# Patient Record
Sex: Female | Born: 1990 | Race: Black or African American | Marital: Single | Smoking: Never smoker
Health system: Southern US, Community
[De-identification: ages and names within clinical notes are randomized; demographics above are authoritative.]

## PROBLEM LIST (undated history)

## (undated) ENCOUNTER — Inpatient Hospital Stay (HOSPITAL_COMMUNITY): Payer: Self-pay

## (undated) DIAGNOSIS — Z789 Other specified health status: Secondary | ICD-10-CM

## (undated) HISTORY — PX: APPENDECTOMY: SHX54

---

## 2014-12-23 ENCOUNTER — Encounter: Payer: Self-pay | Admitting: Family

## 2014-12-23 ENCOUNTER — Ambulatory Visit (INDEPENDENT_AMBULATORY_CARE_PROVIDER_SITE_OTHER): Payer: BLUE CROSS/BLUE SHIELD | Admitting: Family

## 2014-12-23 ENCOUNTER — Telehealth: Payer: Self-pay | Admitting: *Deleted

## 2014-12-23 VITALS — BP 100/60 | HR 65 | Temp 98.0°F | Resp 14 | Ht 59.5 in | Wt 114.6 lb

## 2014-12-23 DIAGNOSIS — M549 Dorsalgia, unspecified: Secondary | ICD-10-CM | POA: Insufficient documentation

## 2014-12-23 DIAGNOSIS — Z23 Encounter for immunization: Secondary | ICD-10-CM

## 2014-12-23 DIAGNOSIS — R102 Pelvic and perineal pain: Secondary | ICD-10-CM | POA: Insufficient documentation

## 2014-12-23 DIAGNOSIS — M545 Low back pain: Secondary | ICD-10-CM

## 2014-12-23 DIAGNOSIS — Z0000 Encounter for general adult medical examination without abnormal findings: Secondary | ICD-10-CM

## 2014-12-23 DIAGNOSIS — N946 Dysmenorrhea, unspecified: Secondary | ICD-10-CM | POA: Insufficient documentation

## 2014-12-23 LAB — CBC WITH DIFFERENTIAL/PLATELET
BASOS ABS: 0 10*3/uL (ref 0.0–0.1)
Basophils Relative: 0.8 % (ref 0.0–3.0)
EOS PCT: 1.2 % (ref 0.0–5.0)
Eosinophils Absolute: 0 10*3/uL (ref 0.0–0.7)
HCT: 38.2 % (ref 36.0–46.0)
HEMOGLOBIN: 12.9 g/dL (ref 12.0–15.0)
LYMPHS PCT: 46.2 % — AB (ref 12.0–46.0)
Lymphs Abs: 1.6 10*3/uL (ref 0.7–4.0)
MCHC: 33.7 g/dL (ref 30.0–36.0)
MCV: 86.4 fl (ref 78.0–100.0)
MONO ABS: 0.3 10*3/uL (ref 0.1–1.0)
MONOS PCT: 7.6 % (ref 3.0–12.0)
NEUTROS ABS: 1.5 10*3/uL (ref 1.4–7.7)
Neutrophils Relative %: 44.2 % (ref 43.0–77.0)
Platelets: 234 10*3/uL (ref 150.0–400.0)
RBC: 4.42 Mil/uL (ref 3.87–5.11)
RDW: 12.7 % (ref 11.5–15.5)
WBC: 3.5 10*3/uL — ABNORMAL LOW (ref 4.0–10.5)

## 2014-12-23 LAB — HEPATIC FUNCTION PANEL
ALT: 7 U/L (ref 0–35)
AST: 14 U/L (ref 0–37)
Albumin: 4.2 g/dL (ref 3.5–5.2)
Alkaline Phosphatase: 42 U/L (ref 39–117)
BILIRUBIN DIRECT: 0.1 mg/dL (ref 0.0–0.3)
Total Bilirubin: 0.8 mg/dL (ref 0.2–1.2)
Total Protein: 7.4 g/dL (ref 6.0–8.3)

## 2014-12-23 LAB — LIPID PANEL
CHOL/HDL RATIO: 4
Cholesterol: 170 mg/dL (ref 0–200)
HDL: 45.9 mg/dL (ref >39.00)
LDL CALC: 112 mg/dL — AB (ref 0–99)
NonHDL: 124.1
TRIGLYCERIDES: 59 mg/dL (ref 0.0–149.0)
VLDL: 11.8 mg/dL (ref 0.0–40.0)

## 2014-12-23 LAB — BASIC METABOLIC PANEL
BUN: 10 mg/dL (ref 6–23)
CALCIUM: 9.7 mg/dL (ref 8.4–10.5)
CO2: 27 mEq/L (ref 19–32)
CREATININE: 0.76 mg/dL (ref 0.40–1.20)
Chloride: 106 mEq/L (ref 96–112)
GFR: 118.6 mL/min (ref >60.00)
Glucose, Bld: 91 mg/dL (ref 70–99)
POTASSIUM: 4.2 meq/L (ref 3.5–5.1)
Sodium: 137 mEq/L (ref 135–145)

## 2014-12-23 LAB — URINALYSIS, ROUTINE W REFLEX MICROSCOPIC
BILIRUBIN URINE: NEGATIVE
HGB URINE DIPSTICK: NEGATIVE
Ketones, ur: NEGATIVE
LEUKOCYTES UA: NEGATIVE
NITRITE: NEGATIVE
Specific Gravity, Urine: 1.025 (ref 1.000–1.030)
TOTAL PROTEIN, URINE-UPE24: NEGATIVE
UROBILINOGEN UA: 0.2 (ref 0.0–1.0)
Urine Glucose: NEGATIVE
pH: 6 (ref 5.0–8.0)

## 2014-12-23 LAB — TSH: TSH: 1.22 u[IU]/mL (ref 0.35–4.50)

## 2014-12-23 MED ORDER — MELOXICAM 7.5 MG PO TABS: 7.5000 mg | ORAL_TABLET | Freq: Every day | ORAL | Status: DC

## 2014-12-23 NOTE — Telephone Encounter (Signed)
Pt seen on 12/23/14. Did not have wellness form with her. Waist circumference (26.5") Wellness form does not require physician signature. Pt states she may not be able to get another printed copy. Advised her I could give her verbal results / readings for required values to complete her form if hard copy not available to send us. I will call pt on Friday to determine status.

## 2014-12-23 NOTE — Assessment & Plan Note (Signed)
We discussed add OCP for birth control and to help with dysmenorrhea, pt declines birth control at this time. Advised NSAIDS to begin a few days before menses.

## 2014-12-23 NOTE — Progress Notes (Signed)
Subjective:    Patient ID: Emily Gomez, female    DOB: 04/15/1991, 24 y.o.   MRN: [id number]  HPI  Emily Gomez is a 24 yr old female who presents today to establish care.    Reports menstrual cycle q28 days.  Cycle 4-5 days.  Reports that the first day is heavy but then lightens up.  Reports that the cramping is bad on the first and last day.  Uses aleve prn which helps the pain.  Does not wish to become pregnant, not using condoms, declines birth control.    Back pain- reports that this started recently as a sharp pain in her back.  Reports that pain is located in the lower back. Pain is in the low back, has not tried anything for the pain.  Pain is not worsened by movements.  Denies associated dysuria or hematuria.   Review of Systems  Constitutional:       Reports that she has lost 5 pounds   HENT: Negative for hearing loss.   Respiratory: Negative for cough.   Cardiovascular:       Denies current chest pain  Gastrointestinal: Negative for constipation.  Genitourinary: Negative for dysuria and hematuria.  Musculoskeletal: Positive for back pain.  Skin: Negative for rash.  Neurological: Negative for headaches.  Hematological: Negative for adenopathy.  Psychiatric/Behavioral:       Denies depression/anxiety   History reviewed. No pertinent past medical history.  History   Social History  . Marital Status: Single    Spouse Name: N/A    Number of Children: N/A  . Years of Education: N/A   Occupational History  . Not on file.   Social History Main Topics  . Smoking status: Never Smoker   . Smokeless tobacco: Never Used  . Alcohol Use: No  . Drug Use: Not on file  . Sexual Activity: Not on file   Other Topics Concern  . Not on file   Social History Narrative   Lives with boyfriend and his mother   No children   Works at the Emergency planning/management officerdeli dept at [employer]   She is studying medical office administration at [employer]   [place] here [place]   Has cousins  here but rest of family is back in [place]       Past Surgical History  Procedure Laterality Date  . Appendectomy      Family History  Problem Relation Age of Onset  . Cancer Neg Hx   . Kidney disease Neg Hx   . Heart disease Neg Hx     No Known Allergies  No current outpatient prescriptions on file prior to visit.   No current facility-administered medications on file prior to visit.    BP 100/60 mmHg  Pulse 65  Temp(Src) 98 F (36.7 C) (Oral)  Resp 14  Ht 4' 11.5" (1.511 m)  Wt 114 lb 9.6 oz (51.982 kg)  BMI 22.77 kg/m2  SpO2 99%  LMP 12/02/2014       Objective:   Physical Exam  Constitutional: She appears well-developed and well-nourished. No distress.  HENT:  Head: Normocephalic.  Cardiovascular: Normal rate and regular rhythm.   No murmur heard. Pulmonary/Chest: Effort normal and breath sounds normal. No respiratory distress. She has no wheezes. She has no rales. She exhibits no tenderness.  Abdominal: Soft. She exhibits no distension. There is no tenderness. There is no rebound and no guarding.  Musculoskeletal: She exhibits no edema.  Thoracic back: She exhibits no tenderness.       Lumbar back: She exhibits no tenderness.  Skin: Skin is warm and dry.  Psychiatric: She has a normal mood and affect. Her behavior is normal. Thought content normal.          Assessment & Plan:  Pt is requesting lab work for insurance purposes which will be added on today. She will return for cpx.

## 2014-12-23 NOTE — Patient Instructions (Signed)
Please complete lab work prior to leaving. Start meloxicam once daily for next week for low back pain, let us know if back pain worsens or if it does not improve. Schedule physical at the front desk.  Welcome to [facility]!

## 2014-12-23 NOTE — Assessment & Plan Note (Signed)
likley musculoskeletal, add trial of meloxicam.

## 2014-12-24 ENCOUNTER — Encounter: Payer: Self-pay | Admitting: Family

## 2014-12-30 NOTE — Telephone Encounter (Signed)
Spoke with Lawanna KobusAngel, he will bring form by the office tomorrow around 12pm.

## 2014-12-30 NOTE — Telephone Encounter (Signed)
Left message for pt's friend, Lawanna Kobusngel to return my call.

## 2015-01-06 NOTE — Telephone Encounter (Signed)
I do not see that we have received form yet. Left message for Lawanna Kobusngel to return my call to determine status of form.

## 2015-01-06 NOTE — Telephone Encounter (Signed)
Form found and faxed to [facility] at [phone]. Left detailed message on Angel's voicemail that form has been found and faxed.

## 2015-01-15 ENCOUNTER — Ambulatory Visit (INDEPENDENT_AMBULATORY_CARE_PROVIDER_SITE_OTHER): Payer: BLUE CROSS/BLUE SHIELD | Admitting: Family

## 2015-01-15 ENCOUNTER — Encounter: Payer: Self-pay | Admitting: Family

## 2015-01-15 VITALS — BP 125/78 | HR 76 | Temp 97.9°F | Resp 16 | Ht 59.5 in | Wt 115.0 lb

## 2015-01-15 DIAGNOSIS — E010 Iodine-deficiency related diffuse (endemic) goiter: Secondary | ICD-10-CM | POA: Insufficient documentation

## 2015-01-15 DIAGNOSIS — Z0000 Encounter for general adult medical examination without abnormal findings: Secondary | ICD-10-CM | POA: Insufficient documentation

## 2015-01-15 NOTE — Progress Notes (Signed)
Pre visit review using our clinic review tool, if applicable. No additional management support is needed unless otherwise documented below in the visit note. 

## 2015-01-15 NOTE — Assessment & Plan Note (Signed)
Continue healthy diet, advised pt on importance of regular exercise.  Lab work is reviewed from last visit and appears stable.

## 2015-01-15 NOTE — Progress Notes (Signed)
Subjective:    Patient ID: Emily Gomez, female    DOB: 1991/02/16, 24 y.o.   MRN: [id number]  HPI  Ms. Swaim presents today for complete physical. Denies current health concerns.  Immunizations: Tdap 11/2014; declines flu shot Diet: eats healthy diet- plenty of fruits and vegetables. Doesn't eat much fast/processed food. Exercise: not exercising. Pap Smear: desires referral to gyn. Has never had pap smear.  1. Low back pain:  Was seen for back pain 12/23/2014. She reports pain is resolved.  Review of Systems  Constitutional: Negative for fever, fatigue and unexpected weight change.  HENT: Negative for congestion, hearing loss and rhinorrhea.   Eyes: Negative for visual disturbance.  Respiratory: Negative for cough and shortness of breath.   Cardiovascular: Negative for chest pain and leg swelling.  Gastrointestinal: Negative for abdominal pain, diarrhea and constipation.  Genitourinary: Negative for dysuria, frequency and menstrual problem (Reports period is regular.).  Musculoskeletal: Negative for myalgias and back pain.  Neurological: Negative for headaches.  Hematological: Does not bruise/bleed easily.  Psychiatric/Behavioral:       Denies depression/anxiety.   History reviewed. No pertinent past medical history.  History   Social History  . Marital Status: Single    Spouse Name: N/A  . Number of Children: N/A  . Years of Education: N/A   Occupational History  . Not on file.   Social History Main Topics  . Smoking status: Never Smoker   . Smokeless tobacco: Never Used  . Alcohol Use: No  . Drug Use: Not on file  . Sexual Activity: Not on file   Other Topics Concern  . 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 Appendectomy      Family History  Problem Relation Age of Onset  . Cancer Neg Hx   . Kidney disease Neg Hx   . Heart disease Neg Hx     No Known Allergies  Current Outpatient Prescriptions on File Prior to Visit  Medication Sig Dispense Refill  . meloxicam (MOBIC) 7.5 MG tablet Take 1 tablet (7.5 mg total) by mouth daily. For 1 week. (Patient not taking: Reported on 01/15/2015) 14 tablet 0   No current facility-administered medications on file prior to visit.    BP 125/78 mmHg  Pulse 76  Temp(Src) 97.9 F (36.6 C) (Oral)  Resp 16  Ht 4' 11.5" (1.511 m)  Wt 115 lb (52.164 kg)  BMI 22.85 kg/m2  SpO2 100%  LMP 12/02/2014       Objective:   Physical Exam  Physical Exam  Constitutional: She is oriented to person, place, and time. She appears well-developed and well-nourished. No distress.  HENT:  Head: Normocephalic and atraumatic.  Right Ear: Tympanic membrane and ear canal normal.  Left Ear: Tympanic membrane and ear canal normal.  Mouth/Throat: Oropharynx is clear and moist.  Eyes: Pupils are equal, round, and reactive to light. No scleral icterus.  Neck: Normal range of motion. Mild thyromegaly present. some fullness left lower thyroid lobe Cardiovascular: Normal rate and regular rhythm.   No murmur heard. Pulmonary/Chest: Effort normal and breath  sounds normal. No respiratory distress. He has no wheezes. She has no rales. She exhibits no tenderness.  Abdominal: Soft. Bowel sounds are normal. He exhibits no distension and no mass. There is no tenderness. There is no rebound and no guarding.  Musculoskeletal: She exhibits no edema.  Lymphadenopathy:    She has no cervical adenopathy.  Neurological: She is alert and oriented to person, place, and time.  She exhibits normal muscle tone. Coordination normal.  Skin: Skin is warm and dry.  Psychiatric: She has a normal mood and affect. Her behavior is normal.  Judgment and thought content normal.  Breast/pelvic: deferred to GYN          Assessment & Plan:         Assessment & Plan:

## 2015-01-15 NOTE — Assessment & Plan Note (Signed)
Will refer for thyroid ultrasound.   Lab Results  Component Value Date   TSH 1.22 12/23/2014   TSH is normal.

## 2015-01-15 NOTE — Patient Instructions (Addendum)
Try to add 30 minutes of exercise 5 days a week. You will be contacted about your thyroid ultrasound to further evaluation your thyroid, and about your referral to GYN. Follow up in 1 year for annual physical, sooner if problems/concerns.

## 2015-01-16 ENCOUNTER — Encounter: Payer: Self-pay | Admitting: Family

## 2015-01-19 ENCOUNTER — Ambulatory Visit (HOSPITAL_BASED_OUTPATIENT_CLINIC_OR_DEPARTMENT_OTHER): Payer: BLUE CROSS/BLUE SHIELD

## 2015-01-20 ENCOUNTER — Ambulatory Visit (HOSPITAL_BASED_OUTPATIENT_CLINIC_OR_DEPARTMENT_OTHER)
Admission: RE | Admit: 2015-01-20 | Discharge: 2015-01-20 | Disposition: A | Discharge status: Home / Self Care | Payer: BLUE CROSS/BLUE SHIELD | Source: Ambulatory Visit | Attending: Family | Admitting: Family

## 2015-01-20 DIAGNOSIS — E010 Iodine-deficiency related diffuse (endemic) goiter: Secondary | ICD-10-CM

## 2015-01-20 DIAGNOSIS — E049 Nontoxic goiter, unspecified: Secondary | ICD-10-CM | POA: Insufficient documentation

## 2015-02-05 ENCOUNTER — Other Ambulatory Visit: Payer: Self-pay | Admitting: Obstetrics & Gynecology

## 2015-02-05 DIAGNOSIS — N63 Unspecified lump in unspecified breast: Secondary | ICD-10-CM

## 2015-02-09 ENCOUNTER — Other Ambulatory Visit: Payer: BLUE CROSS/BLUE SHIELD

## 2016-03-01 ENCOUNTER — Encounter: Payer: Self-pay | Admitting: Family

## 2016-03-01 ENCOUNTER — Ambulatory Visit (INDEPENDENT_AMBULATORY_CARE_PROVIDER_SITE_OTHER): Payer: Managed Care, Other (non HMO) | Admitting: Family

## 2016-03-01 VITALS — BP 100/66 | HR 69 | Temp 98.3°F | Resp 16 | Ht 59.5 in | Wt 118.0 lb

## 2016-03-01 DIAGNOSIS — N63 Unspecified lump in unspecified breast: Secondary | ICD-10-CM

## 2016-03-01 DIAGNOSIS — R109 Unspecified abdominal pain: Secondary | ICD-10-CM

## 2016-03-01 LAB — CBC WITH DIFFERENTIAL/PLATELET
Basophils Absolute: 0 10*3/uL (ref 0.0–0.1)
Basophils Relative: 0.4 % (ref 0.0–3.0)
Eosinophils Absolute: 0.1 10*3/uL (ref 0.0–0.7)
Eosinophils Relative: 1.6 % (ref 0.0–5.0)
HCT: 37.4 % (ref 36.0–46.0)
Hemoglobin: 12.4 g/dL (ref 12.0–15.0)
LYMPHS ABS: 1.3 10*3/uL (ref 0.7–4.0)
Lymphocytes Relative: 37.1 % (ref 12.0–46.0)
MCHC: 33.1 g/dL (ref 30.0–36.0)
MCV: 87.2 fl (ref 78.0–100.0)
MONO ABS: 0.3 10*3/uL (ref 0.1–1.0)
Monocytes Relative: 8.1 % (ref 3.0–12.0)
NEUTROS PCT: 52.8 % (ref 43.0–77.0)
Neutro Abs: 1.8 10*3/uL (ref 1.4–7.7)
Platelets: 224 10*3/uL (ref 150.0–400.0)
RBC: 4.29 Mil/uL (ref 3.87–5.11)
RDW: 12.9 % (ref 11.5–15.5)
WBC: 3.5 10*3/uL — ABNORMAL LOW (ref 4.0–10.5)

## 2016-03-01 LAB — HEPATIC FUNCTION PANEL
ALK PHOS: 51 U/L (ref 39–117)
ALT: 8 U/L (ref 0–35)
AST: 14 U/L (ref 0–37)
Albumin: 4.3 g/dL (ref 3.5–5.2)
BILIRUBIN DIRECT: 0.1 mg/dL (ref 0.0–0.3)
BILIRUBIN TOTAL: 0.6 mg/dL (ref 0.2–1.2)
Total Protein: 7.6 g/dL (ref 6.0–8.3)

## 2016-03-01 MED ORDER — HYOSCYAMINE SULFATE 0.125 MG PO TABS
0.1250 mg | ORAL_TABLET | ORAL | Status: DC | PRN
Start: 2016-03-01 — End: 2016-09-02

## 2016-03-01 MED FILL — OSCIMIN 0.125 MG TABLET: 0.125 | 5 days supply | Qty: 30 | Fill #0

## 2016-03-01 NOTE — Progress Notes (Signed)
Pre visit review using our clinic review tool, if applicable. No additional management support is needed unless otherwise documented below in the visit note. 

## 2016-03-01 NOTE — Progress Notes (Signed)
Subjective:    Patient ID: Emily Gomez, female    DOB: [birth year], 25 y.o.   MRN: [id number]  HPI  Ms. Emily Gomez is a 25 yr old female who presents today with chief complaint of abdominal pain.  Reports that she had a resolving cold ant the abdominal pain started 2 weeks ago.  Reports pain is sharp and cramping in nature.  Notes episodes of constipation.  Then has some soft mucoid stools. Last BM was 2-3 days ago.  That BM was hard. Denies recent travel.  Denies blood in stool.  Drinking ensure and has decreased appetite. Had episode of vomiting about 4 days ago.  Food does not worsen abdominal pain.  Denies heartburn. Denies fevers.  Denies weight loss, cough/congestion, chest pain/palpitations.  Denies dysuria/frequency, burning.  Reports 2 periods/month. LMP 02/22/16.    Breast pain/mass- reports + breast pain, notes "lumps" in breasts She reports some sick contacts at work.  (works at [employer]).    Review of Systems    see HPI  No past medical history on file.  Social History   Social History  . Marital Status: Single    Spouse Name: N/A  . Number of Children: N/A  . Years of Education: N/A   Occupational History  . Not on file.   Social History Main Topics  . Smoking status: Never Smoker   . Smokeless tobacco: Never Used  . Alcohol Use: No  . Drug Use: Not on file  . Sexual Activity: Not on file   Other Topics Concern  . 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 Appendectomy      Family History  Problem Relation Age of Onset  . Cancer Neg Hx   . Kidney disease Neg Hx   . Heart disease Neg Hx     No Known Allergies  No current outpatient prescriptions  on file prior to visit.   No current facility-administered medications on file prior to visit.    BP 100/66 mmHg  Pulse 69  Temp(Src) 98.3 F (36.8 C) (Oral)  Resp 16  Ht 4' 11.5" (1.511 m)  Wt 118 lb (53.524 kg)  BMI 23.44 kg/m2  SpO2 98%  LMP 02/25/2016    Objective:   Physical Exam  Constitutional: She is oriented to person, place, and time. She appears well-developed and well-nourished.  HENT:  Head: Normocephalic and atraumatic.  Cardiovascular: Normal rate, regular rhythm and normal heart sounds.   No murmur heard. Pulmonary/Chest: Effort normal and breath sounds normal. No respiratory distress. She has no wheezes.  Abdominal: Soft. Bowel sounds are normal. She exhibits no distension. There is no tenderness. There is no rebound.  Neurological: She is alert and oriented to person, place, and time.  Psychiatric: She has a normal mood and affect. Her behavior is normal. Judgment and thought content normal.  Breast: bilateral thickened breast tissue, Most prominent left breast at 12 oclock (firm mobile breast mass approximately 2 inch diameter)        Assessment & Plan:  Breast mass- send for diagnostic Mammo bilateral and left breast US.  Abdominal pain- chronic, no acute features. Suspect ibs vs constipation. Advised pt as follows:   You will be contacted about your referral to GI. Go to the ER if you see blood in your stool or if you develop severe abdominal pain. Please increase water and add fresh fruits and vegetables.  Advance your diet to solids. Call if you develop vomiting. Add Levsin as needed for abdominal cramping.

## 2016-03-01 NOTE — Patient Instructions (Addendum)
Please complete lab work prior to leaving.  You will be contacted about your referral to GI. Go to the ER if you see blood in your stool or if you develop severe abdominal pain. Please increase water and add fresh fruits and vegetables.  Advance your diet to solids. Call if you develop vomiting. Add Levsin as needed for abdominal cramping.

## 2016-03-02 ENCOUNTER — Encounter: Payer: Self-pay | Admitting: *Deleted

## 2016-05-11 IMAGING — US US SOFT TISSUE HEAD/NECK
1 series · 14 of 25 positions shown · non-contrast
Comparison: None.

CLINICAL DATA: Thyromegaly.

EXAM:
THYROID ULTRASOUND
TECHNIQUE: Ultrasound examination of the thyroid gland and adjacent soft
tissues was performed.

[Series 1: us soft tissue head/neck · 0.05mm/px · 14 of 25 slices shown]
[im 1/25]
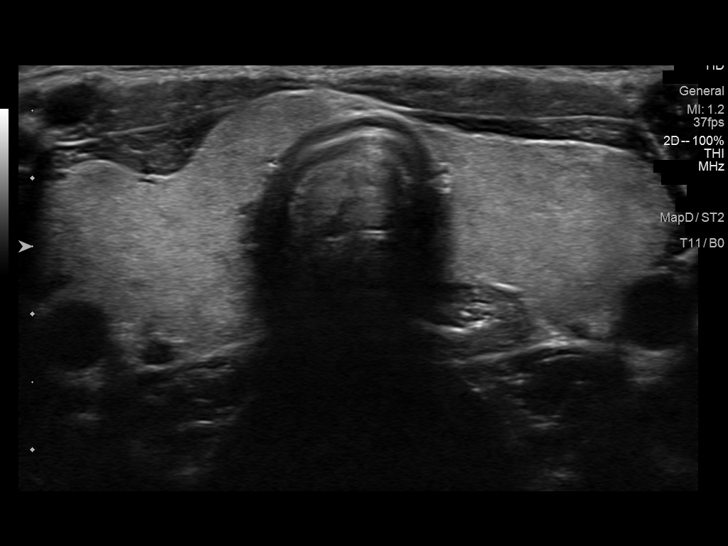
[im 3/25]
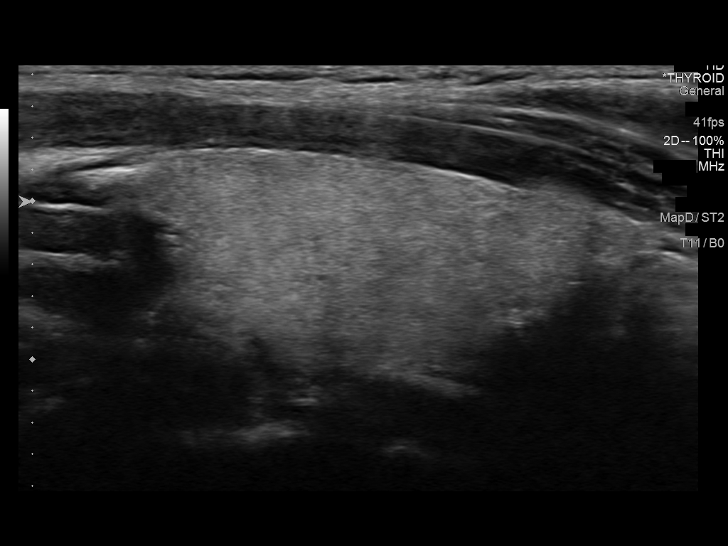
[im 5/25]
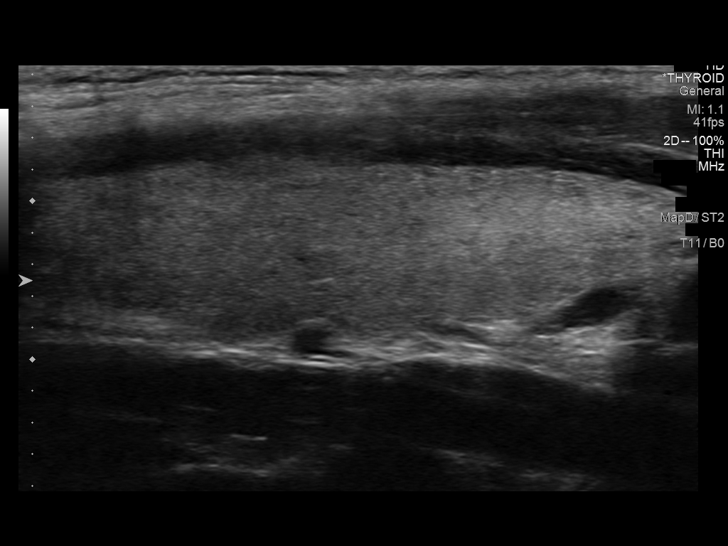
[im 7/25]
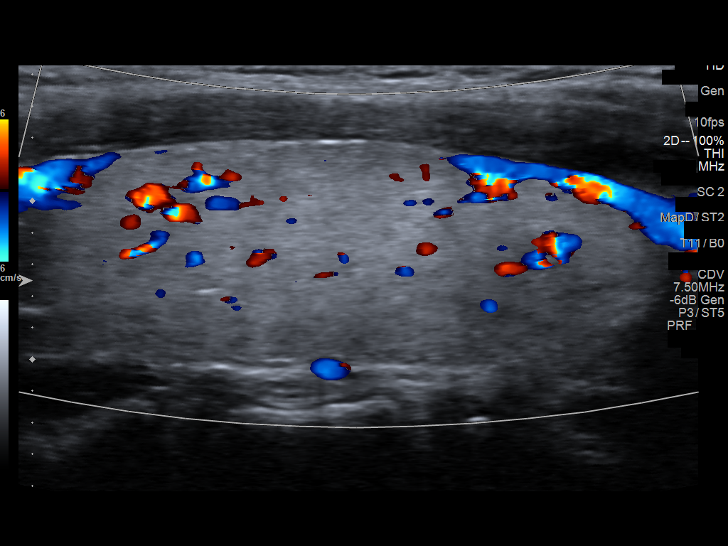
[im 9/25]
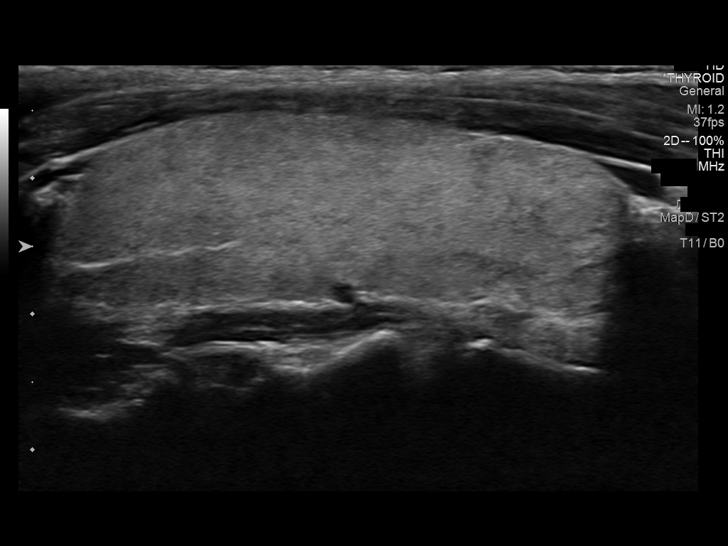
[im 10/25]
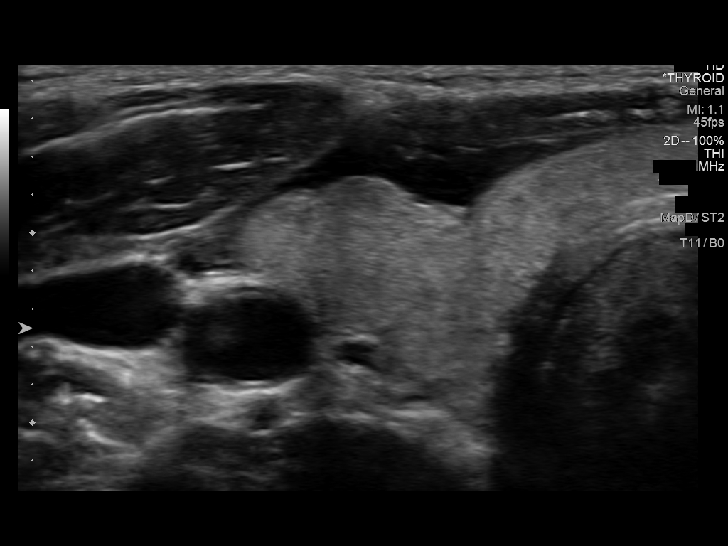
[im 12/25]
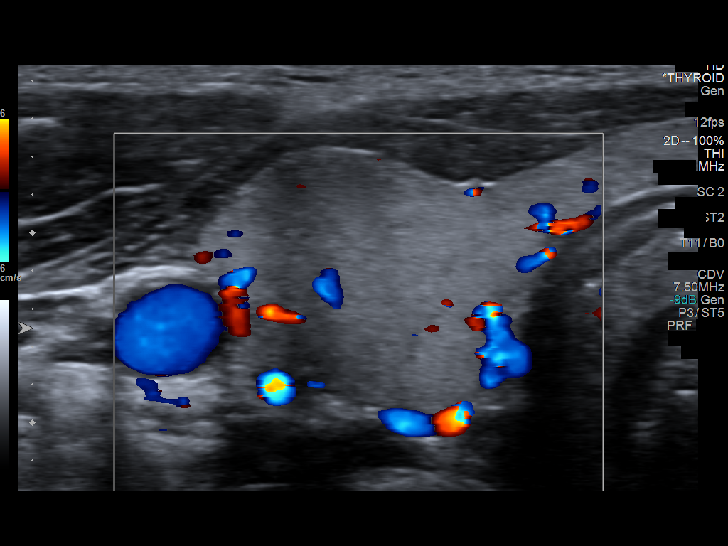
[im 14/25]
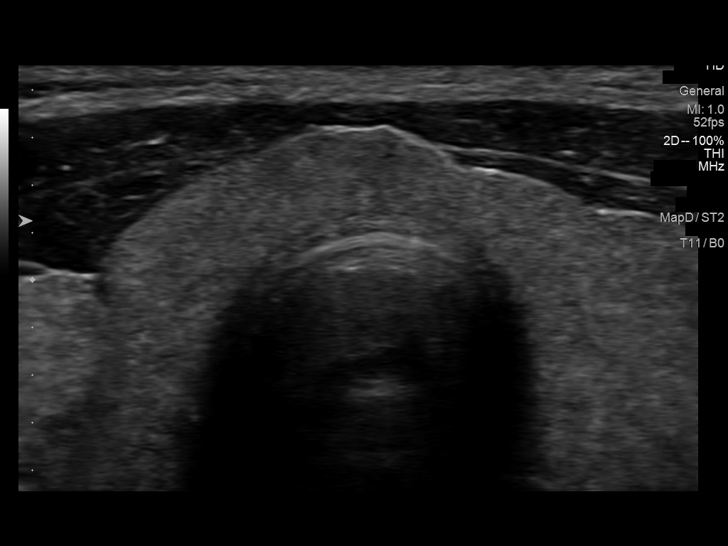
[im 16/25]
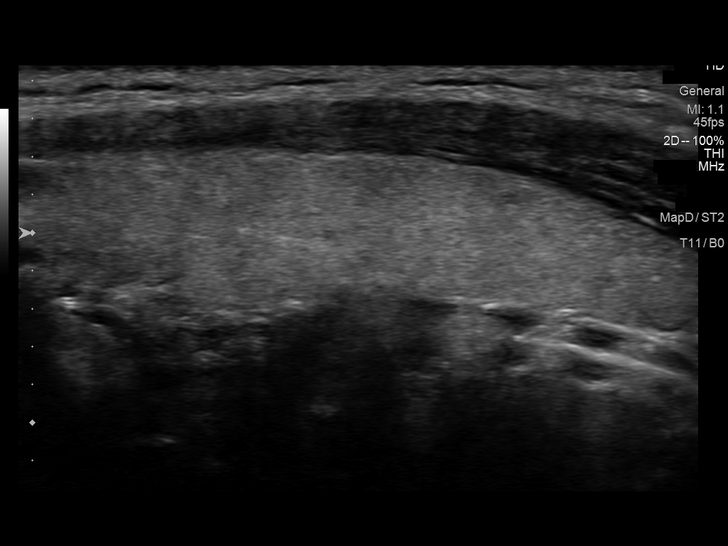
[im 17/25]
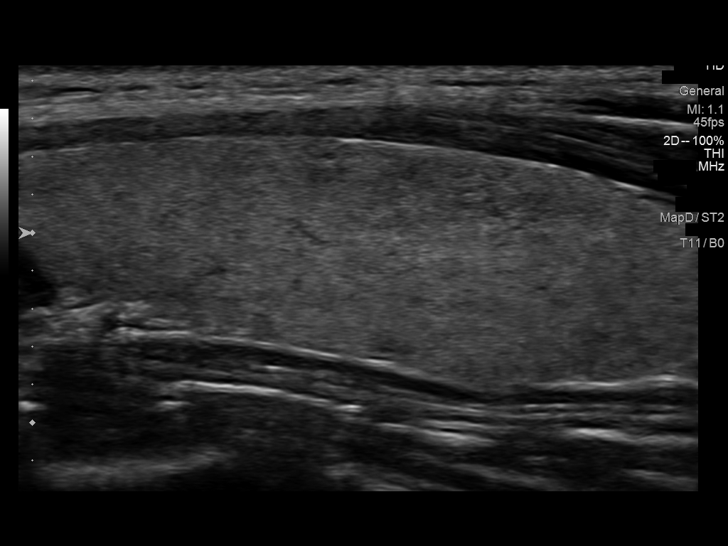
[im 19/25]
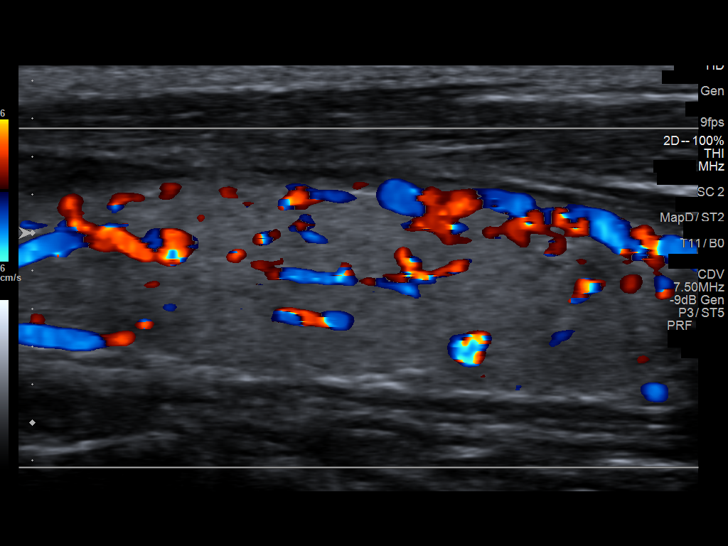
[im 21/25]
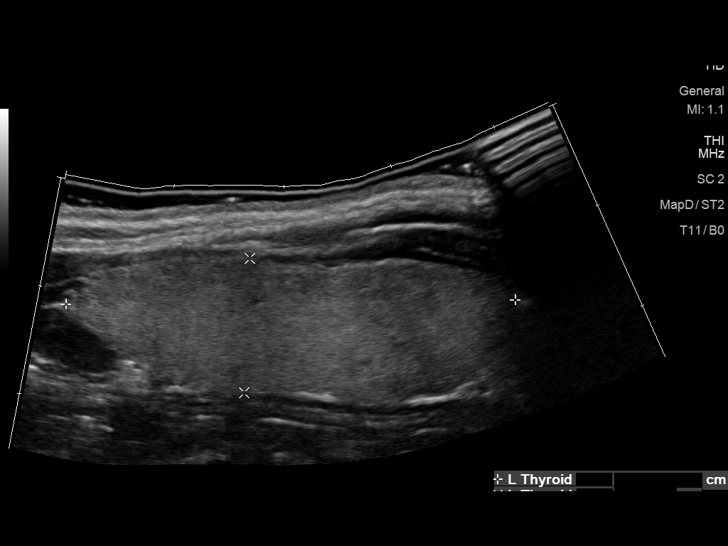
[im 23/25]
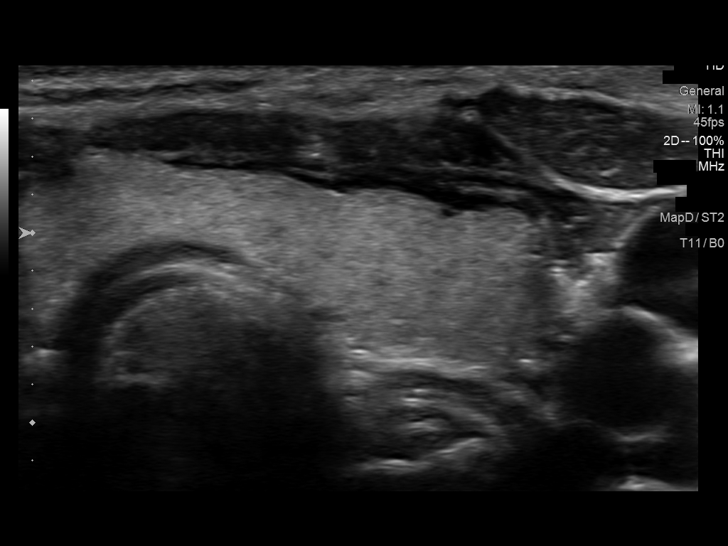
[im 25/25]
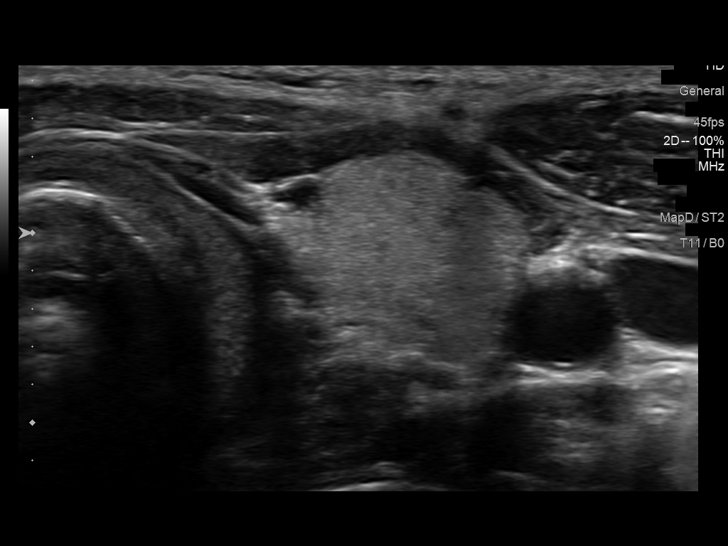

[14 of 25 positions shown; findings below may reference images not displayed]

FINDINGS: Right thyroid lobe

Measurements: 4.2 x 1.6 x 1.9 cm.  No nodules visualized.

Left thyroid lobe

Measurements: 4.1 x 1.2 x 1.8 cm.  No nodules visualized.

Isthmus

Thickness: 0.4 cm.  No nodules visualized.

The thyroid gland is mildly enlarged. Echotexture is normal and
homogeneous. No abnormal vascularity is identified.

Lymphadenopathy

None visualized.
IMPRESSION: Mild thyroid enlargement. Homogeneous gland without evidence of
focal nodules.

## 2016-09-02 ENCOUNTER — Encounter (HOSPITAL_BASED_OUTPATIENT_CLINIC_OR_DEPARTMENT_OTHER): Payer: Self-pay | Admitting: Respiratory Therapy

## 2016-09-02 ENCOUNTER — Telehealth: Payer: Self-pay | Admitting: Family

## 2016-09-02 ENCOUNTER — Emergency Department (HOSPITAL_BASED_OUTPATIENT_CLINIC_OR_DEPARTMENT_OTHER)
Admission: EM | Admit: 2016-09-02 | Discharge: 2016-09-02 | Disposition: A | Discharge status: Home / Self Care | Payer: Managed Care, Other (non HMO) | Attending: Emergency Medicine | Admitting: Emergency Medicine

## 2016-09-02 ENCOUNTER — Emergency Department (HOSPITAL_BASED_OUTPATIENT_CLINIC_OR_DEPARTMENT_OTHER): Payer: Managed Care, Other (non HMO)

## 2016-09-02 DIAGNOSIS — R1084 Generalized abdominal pain: Secondary | ICD-10-CM | POA: Diagnosis not present

## 2016-09-02 LAB — URINALYSIS, ROUTINE W REFLEX MICROSCOPIC
BILIRUBIN URINE: NEGATIVE
GLUCOSE, UA: NEGATIVE mg/dL
HGB URINE DIPSTICK: NEGATIVE
Ketones, ur: NEGATIVE mg/dL
Leukocytes, UA: NEGATIVE
Nitrite: NEGATIVE
Protein, ur: NEGATIVE mg/dL
SPECIFIC GRAVITY, URINE: 1.013 (ref 1.005–1.030)
pH: 7.5 (ref 5.0–8.0)

## 2016-09-02 LAB — CBC WITH DIFFERENTIAL/PLATELET
Basophils Absolute: 0 10*3/uL (ref 0.0–0.1)
Basophils Relative: 0 %
Eosinophils Absolute: 0.1 10*3/uL (ref 0.0–0.7)
Eosinophils Relative: 2 %
HEMATOCRIT: 39.3 % (ref 36.0–46.0)
Hemoglobin: 13.1 g/dL (ref 12.0–15.0)
LYMPHS ABS: 2.1 10*3/uL (ref 0.7–4.0)
LYMPHS PCT: 42 %
MCH: 29.1 pg (ref 26.0–34.0)
MCHC: 33.3 g/dL (ref 30.0–36.0)
MCV: 87.3 fL (ref 78.0–100.0)
MONO ABS: 0.5 10*3/uL (ref 0.1–1.0)
MONOS PCT: 9 %
NEUTROS ABS: 2.3 10*3/uL (ref 1.7–7.7)
Neutrophils Relative %: 47 %
Platelets: 236 10*3/uL (ref 150–400)
RBC: 4.5 MIL/uL (ref 3.87–5.11)
RDW: 11.9 % (ref 11.5–15.5)
WBC: 4.9 10*3/uL (ref 4.0–10.5)

## 2016-09-02 LAB — WET PREP, GENITAL
CLUE CELLS WET PREP: NONE SEEN
Sperm: NONE SEEN
Trich, Wet Prep: NONE SEEN
Yeast Wet Prep HPF POC: NONE SEEN

## 2016-09-02 LAB — COMPREHENSIVE METABOLIC PANEL
ALT: 20 U/L (ref 14–54)
AST: 22 U/L (ref 15–41)
Albumin: 4.4 g/dL (ref 3.5–5.0)
Alkaline Phosphatase: 66 U/L (ref 38–126)
Anion gap: 9 (ref 5–15)
BILIRUBIN TOTAL: 0.7 mg/dL (ref 0.3–1.2)
BUN: 13 mg/dL (ref 6–20)
CO2: 24 mmol/L (ref 22–32)
Calcium: 10.3 mg/dL (ref 8.9–10.3)
Chloride: 106 mmol/L (ref 101–111)
Creatinine, Ser: 0.86 mg/dL (ref 0.44–1.00)
GFR calc Af Amer: >60 mL/min (ref >60)
GFR calc non Af Amer: >60 mL/min (ref >60)
Glucose, Bld: 99 mg/dL (ref 65–99)
Potassium: 3.6 mmol/L (ref 3.5–5.1)
Sodium: 139 mmol/L (ref 135–145)
TOTAL PROTEIN: 7.7 g/dL (ref 6.5–8.1)

## 2016-09-02 LAB — LIPASE, BLOOD: LIPASE: 31 U/L (ref 11–51)

## 2016-09-02 LAB — PREGNANCY, URINE: Preg Test, Ur: NEGATIVE

## 2016-09-02 MED ORDER — IOPAMIDOL (ISOVUE-300) INJECTION 61%
100.0000 mL | Freq: Once | INTRAVENOUS | Status: AC | PRN
  Administered 2016-09-02: 100 mL via INTRAVENOUS

## 2016-09-02 MED ORDER — ONDANSETRON HCL 4 MG/2ML IJ SOLN
4.0000 mg | Freq: Once | INTRAMUSCULAR | Status: AC
  Administered 2016-09-02: 4 mg via INTRAVENOUS
  Filled 2016-09-02: qty 2

## 2016-09-02 MED ORDER — FENTANYL CITRATE (PF) 100 MCG/2ML IJ SOLN
100.0000 ug | Freq: Once | INTRAMUSCULAR | Status: DC
  Filled 2016-09-02: qty 2

## 2016-09-02 MED ORDER — HYOSCYAMINE SULFATE 0.125 MG PO TABS: 0.1250 mg | ORAL_TABLET | ORAL | 0 refills | Status: DC | PRN

## 2016-09-02 MED ORDER — FENTANYL CITRATE (PF) 100 MCG/2ML IJ SOLN
100.0000 ug | Freq: Once | INTRAMUSCULAR | Status: AC
  Administered 2016-09-02: 100 ug via INTRAVENOUS
  Filled 2016-09-02: qty 2

## 2016-09-02 NOTE — ED Notes (Signed)
Pt now states she is having some vaginal discharge

## 2016-09-02 NOTE — ED Triage Notes (Signed)
C/ogeneralized  abd pain onset yesterday, denies n/v/d

## 2016-09-02 NOTE — Telephone Encounter (Signed)
Please contact pt and arrange ED follow up with me. Her Blood pressure was also high at the time of her visit and I would like to repeat her blood pressure.

## 2016-09-02 NOTE — ED Provider Notes (Signed)
[facility] Provider Note: Lowella Dell, MD, FACEP  CSN: [id number] MRN: [id number] ARRIVAL: 09/02/16 at 0355   CHIEF COMPLAINT  Abdominal Pain   HISTORY OF PRESENT ILLNESS  Emily Gomez is a 25 y.o. female who complains of generalized abdominal pain since yesterday evening. The pain is described as sharp and crampy (waxing and waning) in nature. There is no focality. Pain is worse with movement or palpation. She denies fever, chills, nausea, vomiting, diarrhea, dysuria, or vaginal bleeding. Pain is moderate to severe at its worst.   History reviewed. No pertinent past medical history.  Past Surgical History:  Procedure Laterality Date  . APPENDECTOMY      Family History  Problem Relation Age of Onset  . Cancer Neg Hx   . Kidney disease Neg Hx   . Heart disease Neg Hx     Social History  Substance Use Topics  . Smoking status: Never Smoker  . Smokeless tobacco: Never Used  . Alcohol use No    Prior to Admission medications   Medication Sig Start Date End Date Taking? Authorizing Provider  hyoscyamine (LEVSIN, ANASPAZ) 0.125 MG tablet Take 1-2 tablets (0.125-0.25 mg total) by mouth every 4 (four) hours as needed. For abdominal cramping. 09/02/16   Paula Libra, MD    Allergies Review of patient's allergies indicates no known allergies.   REVIEW OF SYSTEMS  Negative except as noted here or in the History of Present Illness.   PHYSICAL EXAMINATION  Initial Vital Signs Blood pressure (!) 153/115, pulse 68, temperature 97.9 F (36.6 C), temperature source Oral, resp. rate 26, height 5' (1.524 m), weight 115 lb (52.2 kg), last menstrual period 08/22/2016, SpO2 100 %.  Examination General: Well-developed, well-nourished female in no acute distress; appearance consistent with age of record HENT: normocephalic; atraumatic Eyes: pupils equal, round and reactive to light; extraocular muscles intact Neck: supple Heart: regular rate and rhythm Lungs: clear  to auscultation bilaterally Abdomen: soft; nondistended; diffusely tender; no masses or hepatosplenomegaly; bowel sounds present Extremities: No deformity; full range of motion; pulses normal Neurologic: Awake, alert and oriented; motor function intact in all extremities and symmetric; no facial droop Skin: Warm and dry Psychiatric: Flat affect   RESULTS  Summary of this visit's results, reviewed by myself:   EKG Interpretation  Date/Time:    Ventricular Rate:    PR Interval:    QRS Duration:   QT Interval:    QTC Calculation:   R Axis:     Text Interpretation:        Laboratory Studies: Results for orders placed or performed during the hospital encounter of 09/02/16 (from the past 24 hour(s))  Urinalysis, Routine w reflex microscopic (not at [facility])     Status: None   Collection Time: 09/02/16  4:15 AM  Result Value Ref Range   Color, Urine YELLOW YELLOW   APPearance CLEAR CLEAR   Specific Gravity, Urine 1.013 1.005 - 1.030   pH 7.5 5.0 - 8.0   Glucose, UA NEGATIVE NEGATIVE mg/dL   Hgb urine dipstick NEGATIVE NEGATIVE   Bilirubin Urine NEGATIVE NEGATIVE   Ketones, ur NEGATIVE NEGATIVE mg/dL   Protein, ur NEGATIVE NEGATIVE mg/dL   Nitrite NEGATIVE NEGATIVE   Leukocytes, UA NEGATIVE NEGATIVE  Pregnancy, urine     Status: None   Collection Time: 09/02/16  4:15 AM  Result Value Ref Range   Preg Test, Ur NEGATIVE NEGATIVE  CBC with Differential     Status: None   Collection Time: 09/02/16  4:15 AM  Result Value Ref Range   WBC 4.9 4.0 - 10.5 K/uL   RBC 4.50 3.87 - 5.11 MIL/uL   Hemoglobin 13.1 12.0 - 15.0 g/dL   HCT 62.1 30.8 - 65.7 %   MCV 87.3 78.0 - 100.0 fL   MCH 29.1 26.0 - 34.0 pg   MCHC 33.3 30.0 - 36.0 g/dL   RDW 84.6 96.2 - 95.2 %   Platelets 236 150 - 400 K/uL   Neutrophils Relative % 47 %   Neutro Abs 2.3 1.7 - 7.7 K/uL   Lymphocytes Relative 42 %   Lymphs Abs 2.1 0.7 - 4.0 K/uL   Monocytes Relative 9 %   Monocytes Absolute 0.5 0.1 - 1.0 K/uL    Eosinophils Relative 2 %   Eosinophils Absolute 0.1 0.0 - 0.7 K/uL   Basophils Relative 0 %   Basophils Absolute 0.0 0.0 - 0.1 K/uL  Comprehensive metabolic panel     Status: None   Collection Time: 09/02/16  4:15 AM  Result Value Ref Range   Sodium 139 135 - 145 mmol/L   Potassium 3.6 3.5 - 5.1 mmol/L   Chloride 106 101 - 111 mmol/L   CO2 24 22 - 32 mmol/L   Glucose, Bld 99 65 - 99 mg/dL   BUN 13 6 - 20 mg/dL   Creatinine, Ser 8.41 0.44 - 1.00 mg/dL   Calcium 32.4 8.9 - 40.1 mg/dL   Total Protein 7.7 6.5 - 8.1 g/dL   Albumin 4.4 3.5 - 5.0 g/dL   AST 22 15 - 41 U/L   ALT 20 14 - 54 U/L   Alkaline Phosphatase 66 38 - 126 U/L   Total Bilirubin 0.7 0.3 - 1.2 mg/dL   GFR calc non Af Amer >60 >60 mL/min   GFR calc Af Amer >60 >60 mL/min   Anion gap 9 5 - 15  Lipase, blood     Status: None   Collection Time: 09/02/16  4:15 AM  Result Value Ref Range   Lipase 31 11 - 51 U/L  Wet prep, genital     Status: Abnormal   Collection Time: 09/02/16  4:43 AM  Result Value Ref Range   Yeast Wet Prep HPF POC NONE SEEN NONE SEEN   Trich, Wet Prep NONE SEEN NONE SEEN   Clue Cells Wet Prep HPF POC NONE SEEN NONE SEEN   WBC, Wet Prep HPF POC FEW (A) NONE SEEN   Sperm NONE SEEN    Imaging Studies: Ct Abdomen Pelvis W Contrast  Result Date: 09/02/2016 CLINICAL DATA:  Diffuse abdominal pain for 2 days. EXAM: CT ABDOMEN AND PELVIS WITH CONTRAST TECHNIQUE: Multidetector CT imaging of the abdomen and pelvis was performed using the standard protocol following bolus administration of intravenous contrast. CONTRAST:  ISOVUE-300 IOPAMIDOL (ISOVUE-300) INJECTION 61% COMPARISON:  None. FINDINGS: Lower chest: Lung bases are clear. Hepatobiliary: No focal liver abnormality is seen. No gallstones, gallbladder wall thickening, or biliary dilatation. Pancreas: Unremarkable. No pancreatic ductal dilatation or surrounding inflammatory changes. Spleen: Normal in size without focal abnormality. Adrenals/Urinary  Tract: Adrenal glands are unremarkable. Kidneys are normal, without renal calculi, focal lesion, or hydronephrosis. Bladder is unremarkable. Stomach/Bowel: Stomach is filled without wall thickening. Contrast material has not yet passed out of the stomach. This may be due to time of imaging after contrast ingestion but gastroparesis or gastric outlet obstruction are not excluded. No mass lesions are demonstrated. Small bowel are decompressed. Stool-filled colon without abnormal distention or wall thickening. Appendix  is normal. Vascular/Lymphatic: No significant vascular findings are present. No enlarged abdominal or pelvic lymph nodes. Reproductive: Uterus is anteverted. Endometrial stripe thickness is somewhat prominent, possibly physiologic. No abnormal adnexal masses. Small amount of free fluid in the pelvis is likely physiologic. Other: No abdominal wall hernia or abnormality. No abdominopelvic ascites. Musculoskeletal: No acute or significant osseous findings. IMPRESSION: Stomach is distended without wall thickening. Changes could be due to insufficient delay after ingestion of contrast material but gastroparesis or outlet obstruction are not excluded. No evidence of small bowel dilatation. Diffusely stool-filled colon without distention. Probable physiologic prominence of the endometrium. Probable physiologic free fluid in the pelvis. Electronically Signed   By: Burman NievesWilliam  Stevens M.D.   On: 09/02/2016 05:51    ED COURSE  Nursing notes and initial vitals signs, including pulse oximetry, reviewed.  Vitals:   09/02/16 0407 09/02/16 0408  BP: (!) 153/115   Pulse: 68   Resp: 26   Temp: 97.9 F (36.6 C)   TempSrc: Oral   SpO2: 100%   Weight:  115 lb (52.2 kg)  Height:  5' (1.524 m)   6:00 AM Patient advised of unremarkable lab findings. CT findings are more consistent with insufficient delay after oral contrast as patient has not had nausea or vomiting. She has had similar pain in the past which  was treated with hyoscyamine.  PROCEDURES    ED DIAGNOSES     ICD-9-CM ICD-10-CM   1. Diffuse abdominal pain 789.00 R10.84        Paula LibraJohn Neziah Vogelgesang, MD 09/02/16 [phone]

## 2016-09-05 NOTE — Telephone Encounter (Signed)
lvm for pt to schedule appt for ED follow up w/ PCP per PCP

## 2017-03-10 ENCOUNTER — Other Ambulatory Visit (HOSPITAL_COMMUNITY)
Admission: RE | Admit: 2017-03-10 | Discharge: 2017-03-10 | Disposition: A | Discharge status: Home / Self Care | Payer: Managed Care, Other (non HMO) | Source: Ambulatory Visit | Attending: Family | Admitting: Family

## 2017-03-10 ENCOUNTER — Ambulatory Visit (INDEPENDENT_AMBULATORY_CARE_PROVIDER_SITE_OTHER): Payer: Managed Care, Other (non HMO) | Admitting: Family

## 2017-03-10 ENCOUNTER — Encounter: Payer: Self-pay | Admitting: Family

## 2017-03-10 VITALS — BP 119/75 | HR 69 | Temp 98.0°F | Resp 16 | Ht 60.0 in | Wt 114.0 lb

## 2017-03-10 DIAGNOSIS — N6320 Unspecified lump in the left breast, unspecified quadrant: Secondary | ICD-10-CM

## 2017-03-10 DIAGNOSIS — N898 Other specified noninflammatory disorders of vagina: Secondary | ICD-10-CM

## 2017-03-10 DIAGNOSIS — L298 Other pruritus: Secondary | ICD-10-CM

## 2017-03-10 DIAGNOSIS — Z0001 Encounter for general adult medical examination with abnormal findings: Secondary | ICD-10-CM

## 2017-03-10 DIAGNOSIS — Z0000 Encounter for general adult medical examination without abnormal findings: Secondary | ICD-10-CM

## 2017-03-10 DIAGNOSIS — M791 Myalgia: Secondary | ICD-10-CM | POA: Diagnosis not present

## 2017-03-10 DIAGNOSIS — M7918 Myalgia, other site: Secondary | ICD-10-CM

## 2017-03-10 LAB — LIPID PANEL
CHOL/HDL RATIO: 3
Cholesterol: 163 mg/dL (ref 0–200)
HDL: 52.3 mg/dL (ref >39.00)
LDL CALC: 104 mg/dL — AB (ref 0–99)
NonHDL: 111.16
Triglycerides: 38 mg/dL (ref 0.0–149.0)
VLDL: 7.6 mg/dL (ref 0.0–40.0)

## 2017-03-10 LAB — BASIC METABOLIC PANEL
BUN: 9 mg/dL (ref 6–23)
CALCIUM: 9.2 mg/dL (ref 8.4–10.5)
CO2: 29 meq/L (ref 19–32)
CREATININE: 0.85 mg/dL (ref 0.40–1.20)
Chloride: 104 mEq/L (ref 96–112)
GFR: 102.5 mL/min (ref >60.00)
Glucose, Bld: 89 mg/dL (ref 70–99)
Potassium: 3.7 mEq/L (ref 3.5–5.1)
Sodium: 138 mEq/L (ref 135–145)

## 2017-03-10 LAB — CBC WITH DIFFERENTIAL/PLATELET
Basophils Absolute: 0 10*3/uL (ref 0.0–0.1)
Basophils Relative: 0.6 % (ref 0.0–3.0)
EOS ABS: 0 10*3/uL (ref 0.0–0.7)
Eosinophils Relative: 1 % (ref 0.0–5.0)
HEMATOCRIT: 38.7 % (ref 36.0–46.0)
Hemoglobin: 12.6 g/dL (ref 12.0–15.0)
LYMPHS PCT: 38.9 % (ref 12.0–46.0)
Lymphs Abs: 1.7 10*3/uL (ref 0.7–4.0)
MCHC: 32.6 g/dL (ref 30.0–36.0)
MCV: 90.3 fl (ref 78.0–100.0)
Monocytes Absolute: 0.3 10*3/uL (ref 0.1–1.0)
Monocytes Relative: 7.3 % (ref 3.0–12.0)
NEUTROS ABS: 2.3 10*3/uL (ref 1.4–7.7)
Neutrophils Relative %: 52.2 % (ref 43.0–77.0)
PLATELETS: 222 10*3/uL (ref 150.0–400.0)
RBC: 4.28 Mil/uL (ref 3.87–5.11)
RDW: 12.7 % (ref 11.5–15.5)
WBC: 4.4 10*3/uL (ref 4.0–10.5)

## 2017-03-10 LAB — URINALYSIS, ROUTINE W REFLEX MICROSCOPIC
Bilirubin Urine: NEGATIVE
Hgb urine dipstick: NEGATIVE
Ketones, ur: NEGATIVE
Leukocytes, UA: NEGATIVE
Nitrite: NEGATIVE
PH: 5.5 (ref 5.0–8.0)
RBC / HPF: NONE SEEN (ref 0–?)
SPECIFIC GRAVITY, URINE: 1.025 (ref 1.000–1.030)
Total Protein, Urine: NEGATIVE
Urine Glucose: NEGATIVE
Urobilinogen, UA: 0.2 (ref 0.0–1.0)
WBC, UA: NONE SEEN (ref 0–?)

## 2017-03-10 LAB — HEPATIC FUNCTION PANEL
ALT: 8 U/L (ref 0–35)
AST: 14 U/L (ref 0–37)
Albumin: 4.1 g/dL (ref 3.5–5.2)
Alkaline Phosphatase: 39 U/L (ref 39–117)
Bilirubin, Direct: 0.2 mg/dL (ref 0.0–0.3)
TOTAL PROTEIN: 7.1 g/dL (ref 6.0–8.3)
Total Bilirubin: 0.9 mg/dL (ref 0.2–1.2)

## 2017-03-10 LAB — TSH: TSH: 1.16 u[IU]/mL (ref 0.35–4.50)

## 2017-03-10 NOTE — Patient Instructions (Addendum)
Please complete lab work prior to leaving. For your back/shoulder pain, you may use ibuprofen as needed. Please let me know if pain worsens or fails to improve. Please schedule a routine eye exam. Use condoms to prevent pregnancy. Let me know if you decide you would like to start birth control.

## 2017-03-10 NOTE — Addendum Note (Signed)
Addended by: Mervin Kung A on: 03/10/2017 09:15 AM   Modules accepted: Orders

## 2017-03-10 NOTE — Progress Notes (Signed)
Subjective:    Patient ID: Emily Gomez, female    DOB: 10-22-1991, 26 y.o.   MRN: [id number]  HPI  Patient presents today for complete physical.  Immunizations: tetanus is up to date Diet: fair, feels like she does not eat enough Exercise: walks, jumping jacks etc.  Pap Smear:02/14/15 Wt Readings from Last 3 Encounters:  03/10/17 114 lb (51.7 kg)  09/02/16 115 lb (52.2 kg)  03/01/16 118 lb (53.5 kg)  Vision: due- will schedule Dental: up to date  Reports "severe pain between shoulders." worse with movement, when she raises her right arm pain radiates around to her right breast.   Reports severe vaginal itching after intercourse.    Review of Systems  Constitutional: Negative for unexpected weight change.  HENT: Negative for hearing loss.   Eyes: Negative for visual disturbance.  Respiratory: Negative for cough.   Cardiovascular: Negative for leg swelling.  Gastrointestinal: Negative for constipation and diarrhea.  Genitourinary: Negative for dysuria, frequency and menstrual problem.  Musculoskeletal:       See HPI  Skin: Negative for rash.  Neurological: Negative for headaches.  Hematological: Negative for adenopathy.  Psychiatric/Behavioral:       Denies anxiety/depression       History reviewed. No pertinent past medical history.   Social History   Social History  . Marital status: Single    Spouse name: N/A  . Number of children: N/A  . Years of education: N/A   Occupational History  . Not on file.   Social History Main Topics  . Smoking status: Never Smoker  . Smokeless tobacco: Never Used  . Alcohol use No  . Drug use: Unknown  . Sexual activity: Not on file   Other Topics Concern  . Not on file   Social History Narrative   Lives with boyfriend and his mother   No children   Works at the Emergency planning/management officer at [employer]   She is studying medical office administration at [employer] up in [place], moved here 3/13   Has cousins here but rest of  family is back in [place]       Past Surgical History:  Procedure Laterality Date  . APPENDECTOMY      Family History  Problem Relation Age of Onset  . Cancer Neg Hx   . Kidney disease Neg Hx   . Heart disease Neg Hx     No Known Allergies  No current outpatient prescriptions on file prior to visit.   No current facility-administered medications on file prior to visit.     BP 119/75 (BP Location: Right Arm, Cuff Size: Normal)   Pulse 69   Temp 98 F (36.7 C) (Oral)   Resp 16   Ht 5' (1.524 m)   Wt 114 lb (51.7 kg)   SpO2 100% Comment: room air  BMI 22.26 kg/m    Objective:   Physical Exam  Physical Exam  Constitutional: She is oriented to person, place, and time. She appears well-developed and well-nourished. No distress.  HENT:  Head: Normocephalic and atraumatic.  Right Ear: Tympanic membrane and ear canal normal.  Left Ear: Tympanic membrane and ear canal normal.  Mouth/Throat: Oropharynx is clear and moist.  Eyes: Pupils are equal, round, and reactive to light. No scleral icterus.  Neck: Normal range of motion. No thyromegaly present.  Cardiovascular: Normal rate and regular rhythm.   No murmur heard. Pulmonary/Chest: Effort normal and breath sounds normal. No respiratory distress. He has  no wheezes. She has no rales. She exhibits no tenderness.  Abdominal: Soft. Bowel sounds are normal. She exhibits no distension and no mass. There is no tenderness. There is no rebound and no guarding.  Musculoskeletal: She exhibits no edema. + right anterior chest wall tenderness to palpation Lymphadenopathy:    She has no cervical adenopathy.  Neurological: She is alert and oriented to person, place, and time. She has normal patellar reflexes. She exhibits normal muscle tone. Coordination normal.  Skin: Skin is warm and dry.  Psychiatric: She has a normal mood and affect. Her behavior is normal. Judgment and thought content normal.  Breasts: Examined lying Right:  Without masses, retractions, discharge or axillary adenopathy.  Left: large area of thickening left breast, (left upper outer quadrant) Inguinal/mons: Normal without inguinal adenopathy  External genitalia: Normal  BUS/Urethra/Skene's glands: Normal  Bladder: Normal  Vagina: Normal  Cervix: Normal  Uterus: normal in size, shape and contour. Midline and mobile  Adnexa/parametria:  Rt: Without masses or tenderness.  Lt: Without masses or tenderness.  Anus and perineum: Normal            Assessment & Plan:   Preventative care- discussed importance of 3 well balanced meals. Immunizations reviewed and up to date. Obtain routine lab work. Declines birth control. Does not want to become pregnant. Advised condom use.  Breast mass- will refer for diagnostic mammo/US at the breast center. Advised pt to let me know if she has not heard back about this appointment in 1 week.  Vaginal itching- we discussed use of a mild, fragrance free soap such as aveeno or cetaphil. Wet Prep/GC chlamydia swabs obtained today. Will plan to treat pending review of these results.  Musculoskeletal pain- improving, advised pt that she can use ibuprofen prn, but let me know if new/worsening symptoms or if symptoms do not continue to improve.        Assessment & Plan:

## 2017-03-10 NOTE — Progress Notes (Signed)
Pre visit review using our clinic review tool, if applicable. No additional management support is needed unless otherwise documented below in the visit note. 

## 2017-03-13 LAB — CERVICOVAGINAL ANCILLARY ONLY
Chlamydia: NEGATIVE
Neisseria Gonorrhea: NEGATIVE
Wet Prep (BD Affirm): NEGATIVE

## 2017-03-20 ENCOUNTER — Telehealth: Payer: Self-pay | Admitting: Family

## 2017-03-20 NOTE — Telephone Encounter (Signed)
Attempted to reach pt and left message for her to return my call. Also mailed letter to pt with contact # for the Breast Center.

## 2017-03-20 NOTE — Telephone Encounter (Signed)
I see that she has not yet scheduled breast imaging, please give her number for breast center to call. This is very important.

## 2017-07-21 ENCOUNTER — Ambulatory Visit (INDEPENDENT_AMBULATORY_CARE_PROVIDER_SITE_OTHER): Payer: Managed Care, Other (non HMO) | Admitting: Family Medicine

## 2017-07-21 ENCOUNTER — Encounter: Payer: Self-pay | Admitting: Family Medicine

## 2017-07-21 VITALS — BP 121/71 | HR 70 | Wt 112.0 lb

## 2017-07-21 DIAGNOSIS — Z3200 Encounter for pregnancy test, result unknown: Secondary | ICD-10-CM

## 2017-07-21 DIAGNOSIS — Z3A01 Less than 8 weeks gestation of pregnancy: Secondary | ICD-10-CM | POA: Diagnosis not present

## 2017-07-21 LAB — POCT URINE PREGNANCY: PREG TEST UR: POSITIVE — AB

## 2017-07-21 MED ORDER — PRENATAL VITAMIN 27-0.8 MG PO TABS: 1.0000 | ORAL_TABLET | Freq: Every day | ORAL | 11 refills | Status: DC

## 2017-07-21 MED FILL — PRENATAL VITAMIN PLUS LOW I: 27-1 | 30 days supply | Qty: 30 | Fill #0

## 2017-07-21 NOTE — Patient Instructions (Signed)
First Trimester of Pregnancy The first trimester of pregnancy is from week 1 until the end of week 13 (months 1 through 3). During this time, your baby will begin to develop inside you. At 6-8 weeks, the eyes and face are formed, and the heartbeat can be seen on ultrasound. At the end of 12 weeks, all the baby's organs are formed. Prenatal care is all the medical care you receive before the birth of your baby. Make sure you get good prenatal care and follow all of your doctor's instructions. Follow these instructions at home: Medicines  Take over-the-counter and prescription medicines only as told by your doctor. Some medicines are safe and some medicines are not safe during pregnancy.  Take a prenatal vitamin that contains at least 600 micrograms (mcg) of folic acid.  If you have trouble pooping (constipation), take medicine that will make your stool soft (stool softener) if your doctor approves. Eating and drinking  Eat regular, healthy meals.  Your doctor will tell you the amount of weight gain that is right for you.  Avoid raw meat and uncooked cheese.  If you feel sick to your stomach (nauseous) or throw up (vomit): ? Eat 4 or 5 small meals a day instead of 3 large meals. ? Try eating a few soda crackers. ? Drink liquids between meals instead of during meals.  To prevent constipation: ? Eat foods that are high in fiber, like fresh fruits and vegetables, whole grains, and beans. ? Drink enough fluids to keep your pee (urine) clear or pale yellow. Activity  Exercise only as told by your doctor. Stop exercising if you have cramps or pain in your lower belly (abdomen) or low back.  Do not exercise if it is too hot, too humid, or if you are in a place of great height (high altitude).  Try to avoid standing for long periods of time. Move your legs often if you must stand in one place for a long time.  Avoid heavy lifting.  Wear low-heeled shoes. Sit and stand up straight.  You  can have sex unless your doctor tells you not to. Relieving pain and discomfort  Wear a good support bra if your breasts are sore.  Take warm water baths (sitz baths) to soothe pain or discomfort caused by hemorrhoids. Use hemorrhoid cream if your doctor says it is okay.  Rest with your legs raised if you have leg cramps or low back pain.  If you have puffy, bulging veins (varicose veins) in your legs: ? Wear support hose or compression stockings as told by your doctor. ? Raise (elevate) your feet for 15 minutes, 3-4 times a day. ? Limit salt in your food. Prenatal care  Schedule your prenatal visits by the twelfth week of pregnancy.  Write down your questions. Take them to your prenatal visits.  Keep all your prenatal visits as told by your doctor. This is important. Safety  Wear your seat belt at all times when driving.  Make a list of emergency phone numbers. The list should include numbers for family, friends, the hospital, and police and fire departments. General instructions  Ask your doctor for a referral to a local prenatal class. Begin classes no later than at the start of month 6 of your pregnancy.  Ask for help if you need counseling or if you need help with nutrition. Your doctor can give you advice or tell you where to go for help.  Do not use hot tubs, steam rooms, or   saunas.  Do not douche or use tampons or scented sanitary pads.  Do not cross your legs for long periods of time.  Avoid all herbs and alcohol. Avoid drugs that are not approved by your doctor.  Do not use any tobacco products, including cigarettes, chewing tobacco, and electronic cigarettes. If you need help quitting, ask your doctor. You may get counseling or other support to help you quit.  Avoid cat litter boxes and soil used by cats. These carry germs that can cause birth defects in the baby and can cause a loss of your baby (miscarriage) or stillbirth.  Visit your dentist. At home, brush  your teeth with a soft toothbrush. Be gentle when you floss. Contact a doctor if:  You are dizzy.  You have mild cramps or pressure in your lower belly.  You have a nagging pain in your belly area.  You continue to feel sick to your stomach, you throw up, or you have watery poop (diarrhea).  You have a bad smelling fluid coming from your vagina.  You have pain when you pee (urinate).  You have increased puffiness (swelling) in your face, hands, legs, or ankles. Get help right away if:  You have a fever.  You are leaking fluid from your vagina.  You have spotting or bleeding from your vagina.  You have very bad belly cramping or pain.  You gain or lose weight rapidly.  You throw up blood. It may look like coffee grounds.  You are around people who have German measles, fifth disease, or chickenpox.  You have a very bad headache.  You have shortness of breath.  You have any kind of trauma, such as from a fall or a car accident. Summary  The first trimester of pregnancy is from week 1 until the end of week 13 (months 1 through 3).  To take care of yourself and your unborn baby, you will need to eat healthy meals, take medicines only if your doctor tells you to do so, and do activities that are safe for you and your baby.  Keep all follow-up visits as told by your doctor. This is important as your doctor will have to ensure that your baby is healthy and growing well. This information is not intended to replace advice given to you by your health care provider. Make sure you discuss any questions you have with your health care provider. Document Released: 05/02/2008 Document Revised: 11/22/2016 Document Reviewed: 11/22/2016 Elsevier Interactive Patient Education  2017 Elsevier Inc.  

## 2017-07-21 NOTE — Progress Notes (Signed)
Patient ID: Emily Gomez, female    DOB: 1991/07/12  Age: 26 y.o. MRN: [id number]    Subjective:  Subjective  HPI Emily Gomez presents for pregnancy test.  She had a positive test at her office and wanted [place] to verify--- [facility]  06/17/2017     Review of Systems  Constitutional: Negative for chills and fever.  HENT: Negative for congestion and hearing loss.   Eyes: Negative for discharge.  Respiratory: Negative for cough and shortness of breath.   Cardiovascular: Negative for chest pain, palpitations and leg swelling.  Gastrointestinal: Negative for abdominal pain, blood in stool, constipation, diarrhea, nausea and vomiting.  Genitourinary: Negative for dysuria, frequency, hematuria and urgency.  Musculoskeletal: Negative for back pain and myalgias.  Skin: Negative for rash.  Allergic/Immunologic: Negative for environmental allergies.  Neurological: Negative for dizziness, weakness and headaches.  Hematological: Does not bruise/bleed easily.  Psychiatric/Behavioral: Negative for suicidal ideas. The patient is not nervous/anxious.     History No past medical history on file.  She has a past surgical history that includes Appendectomy.   Her family history is not on file.She reports that she has never smoked. She has never used smokeless tobacco. She reports that she does not drink alcohol or use drugs.  No current outpatient prescriptions on file prior to visit.   No current facility-administered medications on file prior to visit.      Objective:  Objective  Physical Exam  Constitutional: She is oriented to person, place, and time. She appears well-developed and well-nourished.  HENT:  Head: Normocephalic and atraumatic.  Eyes: Conjunctivae and EOM are normal.  Neck: Normal range of motion. Neck supple. No JVD present. Carotid bruit is not present. No thyromegaly present.  Cardiovascular: Normal rate, regular rhythm and normal heart sounds.   No murmur  heard. Pulmonary/Chest: Effort normal and breath sounds normal. No respiratory distress. She has no wheezes. She has no rales. She exhibits no tenderness.  Musculoskeletal: She exhibits no edema.  Neurological: She is alert and oriented to person, place, and time.  Psychiatric: She has a normal mood and affect.  Nursing note and vitals reviewed.  BP 121/71   Pulse 70   Wt 112 lb (50.8 kg)   BMI 21.87 kg/m  Wt Readings from Last 3 Encounters:  07/21/17 112 lb (50.8 kg)  03/10/17 114 lb (51.7 kg)  09/02/16 115 lb (52.2 kg)     Lab Results  Component Value Date   WBC 4.4 03/10/2017   HGB 12.6 03/10/2017   HCT 38.7 03/10/2017   PLT 222.0 03/10/2017   GLUCOSE 89 03/10/2017   CHOL 163 03/10/2017   TRIG 38.0 03/10/2017   HDL 52.30 03/10/2017   LDLCALC 104 (H) 03/10/2017   ALT 8 03/10/2017   AST 14 03/10/2017   NA 138 03/10/2017   K 3.7 03/10/2017   CL 104 03/10/2017   CREATININE 0.85 03/10/2017   BUN 9 03/10/2017   CO2 29 03/10/2017   TSH 1.16 03/10/2017    No results found.   Assessment & Plan:  Plan  I am having [name] start on Prenatal Vitamin.  Meds ordered this encounter  Medications  . Prenatal Vit-Fe Fumarate-FA (PRENATAL VITAMIN) 27-0.8 MG TABS    Sig: Take 1 tablet by mouth daily.    Dispense:  30 tablet    Refill:  11    Problem List Items Addressed This Visit    None    Visit Diagnoses    Possible pregnancy    -  Primary   Relevant Orders   POCT urine pregnancy (Completed)   Less than [redacted] weeks gestation of pregnancy       Relevant Medications   Prenatal Vit-Fe Fumarate-FA (PRENATAL VITAMIN) 27-0.8 MG TABS   Other Relevant Orders   Ambulatory referral to Obstetrics / Gynecology        edc 03/24/2017--- pt is not positive about the [facility]     ega [redacted] weeks  Follow-up: Return if symptoms worsen or fail to improve.  Donato Schultz, DO

## 2017-07-30 ENCOUNTER — Inpatient Hospital Stay (HOSPITAL_COMMUNITY)
Admission: AD | Admit: 2017-07-30 | Discharge: 2017-07-30 | Disposition: A | Discharge status: Home / Self Care | Payer: Medicaid Other | Source: Ambulatory Visit | Attending: Neurology | Admitting: Neurology

## 2017-07-30 ENCOUNTER — Encounter (HOSPITAL_COMMUNITY): Payer: Self-pay

## 2017-07-30 DIAGNOSIS — Z3A01 Less than 8 weeks gestation of pregnancy: Secondary | ICD-10-CM

## 2017-07-30 DIAGNOSIS — R1010 Upper abdominal pain, unspecified: Secondary | ICD-10-CM

## 2017-07-30 DIAGNOSIS — Z79899 Other long term (current) drug therapy: Secondary | ICD-10-CM | POA: Diagnosis not present

## 2017-07-30 DIAGNOSIS — O26891 Other specified pregnancy related conditions, first trimester: Secondary | ICD-10-CM | POA: Insufficient documentation

## 2017-07-30 HISTORY — DX: Other specified health status: Z78.9

## 2017-07-30 LAB — COMPREHENSIVE METABOLIC PANEL
ALBUMIN: 4.2 g/dL (ref 3.5–5.0)
ALT: 11 U/L — ABNORMAL LOW (ref 14–54)
ANION GAP: 9 (ref 5–15)
AST: 18 U/L (ref 15–41)
Alkaline Phosphatase: 47 U/L (ref 38–126)
BUN: 13 mg/dL (ref 6–20)
CO2: 24 mmol/L (ref 22–32)
Calcium: 10.3 mg/dL (ref 8.9–10.3)
Chloride: 101 mmol/L (ref 101–111)
Creatinine, Ser: 0.73 mg/dL (ref 0.44–1.00)
GFR calc Af Amer: >60 mL/min (ref >60)
GFR calc non Af Amer: >60 mL/min (ref >60)
Glucose, Bld: 85 mg/dL (ref 65–99)
POTASSIUM: 3.5 mmol/L (ref 3.5–5.1)
SODIUM: 134 mmol/L — AB (ref 135–145)
Total Bilirubin: 0.8 mg/dL (ref 0.3–1.2)
Total Protein: 7.3 g/dL (ref 6.5–8.1)

## 2017-07-30 LAB — URINALYSIS, ROUTINE W REFLEX MICROSCOPIC
BILIRUBIN URINE: NEGATIVE
Bacteria, UA: NONE SEEN
Glucose, UA: NEGATIVE mg/dL
Hgb urine dipstick: NEGATIVE
KETONES UR: NEGATIVE mg/dL
Nitrite: NEGATIVE
PROTEIN: NEGATIVE mg/dL
Specific Gravity, Urine: 1.018 (ref 1.005–1.030)
pH: 6 (ref 5.0–8.0)

## 2017-07-30 LAB — CBC
HEMATOCRIT: 35.7 % — AB (ref 36.0–46.0)
Hemoglobin: 12.5 g/dL (ref 12.0–15.0)
MCH: 29.9 pg (ref 26.0–34.0)
MCHC: 35 g/dL (ref 30.0–36.0)
MCV: 85.4 fL (ref 78.0–100.0)
Platelets: 245 10*3/uL (ref 150–400)
RBC: 4.18 MIL/uL (ref 3.87–5.11)
RDW: 12.6 % (ref 11.5–15.5)
WBC: 6 10*3/uL (ref 4.0–10.5)

## 2017-07-30 LAB — HCG, QUANTITATIVE, PREGNANCY: hCG, Beta Chain, Quant, S: 121887 m[IU]/mL — ABNORMAL HIGH (ref <5)

## 2017-07-30 LAB — LIPASE, BLOOD: Lipase: 33 U/L (ref 11–51)

## 2017-07-30 NOTE — MAU Note (Addendum)
Pt c/o upper abdominal pain that started since she found out she was pregnancy. States she "wants to eat" but cannot. Pt denies pain at this time, but states it was worse earlier today and when the pain comes she is unable to sleep at night. Pt denies nausea or vomiting. Pt states she has not taken anything for pain.

## 2017-07-30 NOTE — Discharge Instructions (Signed)
Gastritis, Adult Gastritis is swelling (inflammation) of the stomach. When you have this condition, you can have these problems (symptoms):  Pain in your stomach.  A burning feeling in your stomach.  Feeling sick to your stomach (nauseous).  Throwing up (vomiting).  Feeling too full after you eat.  It is important to get help for this condition. Without help, your stomach can bleed, and you can get sores (ulcers) in your stomach. Follow these instructions at home:  Take over-the-counter and prescription medicines only as told by your doctor.  If you were prescribed an antibiotic medicine, take it as told by your doctor. Do not stop taking it even if you start to feel better.  Drink enough fluid to keep your pee (urine) clear or pale yellow.  Instead of eating big meals, eat small meals often. Contact a health care provider if:  Your problems get worse.  Your problems go away and then come back. Get help right away if:  You throw up blood or something that looks like coffee grounds.  You have black or dark red poop (stools).  You cannot keep fluids down.  Your stomach pain gets worse.  You have a fever.  You do not feel better after 1 week. This information is not intended to replace advice given to you by your health care provider. Make sure you discuss any questions you have with your health care provider. Document Released: 05/02/2008 Document Revised: 07/13/2016 Document Reviewed: 08/08/2015 Elsevier Interactive Patient Education  2018 [employer]. [place] Diet A bland diet consists of foods that do not have a lot of fat or fiber. Foods without fat or fiber are easier for the body to digest. They are also less likely to irritate your mouth, throat, stomach, and other parts of your gastrointestinal tract. A bland diet is sometimes called a BRAT diet. What is my plan? Your health care provider or dietitian may recommend specific changes to your diet to prevent and  treat your symptoms, such as:  Eating small meals often.  Cooking food until it is soft enough to chew easily.  Chewing your food well.  Drinking fluids slowly.  Not eating foods that are very spicy, sour, or fatty.  Not eating citrus fruits, such as oranges and grapefruit.  What do I need to know about this diet?  Eat a variety of foods from the bland diet food list.  Do not follow a bland diet longer than you have to.  Ask your health care provider whether you should take vitamins. What foods can I eat? Grains  Hot cereals, such as cream of wheat. Bread, crackers, or tortillas made from refined white flour. Rice. Vegetables Canned or cooked vegetables. Mashed or boiled potatoes. Fruits Bananas. Applesauce. Other types of cooked or canned fruit with the skin and seeds removed, such as canned peaches or pears. Meats and Other Protein Sources Scrambled eggs. Creamy peanut butter or other nut butters. Lean, well-cooked meats, such as chicken or fish. Tofu. Soups or broths. Dairy Low-fat dairy products, such as milk, cottage cheese, or yogurt. Beverages Water. Herbal tea. Apple juice. Sweets and Desserts Pudding. Custard. Fruit gelatin. Ice cream. Fats and Oils Mild salad dressings. Canola or olive oil. The items listed above may not be a complete list of allowed foods or beverages. Contact your dietitian for more options. What foods are not recommended? Foods and ingredients that are often not recommended include:  Spicy foods, such as hot sauce or salsa.  Fried foods.  Sour  foods, such as pickled or fermented foods.  Raw vegetables or fruits, especially citrus or berries.  Caffeinated drinks.  Alcohol.  Strongly flavored seasonings or condiments.  The items listed above may not be a complete list of foods and beverages that are not allowed. Contact your dietitian for more information. This information is not intended to replace advice given to you by your  health care provider. Make sure you discuss any questions you have with your health care provider. Document Released: 03/07/2016 Document Revised: 04/21/2016 Document Reviewed: 11/26/2014 Elsevier Interactive Patient Education  2018 [employer]

## 2017-07-30 NOTE — MAU Provider Note (Signed)
History     CSN: [id number]  Arrival date and time: 07/30/17 2028   First Provider Initiated Contact with Patient 07/30/17 2227      Chief Complaint  Patient presents with  . Abdominal Pain   G1 @[redacted]w[redacted]d  here with upper abdominal pain. Pain started 2 weeks ago. Describes as sharp and intermittent. Doesn't happen every day. No fevers. No N/V/D. Denies heartburn. Reports poor appetite. Denies lower abdominal pain and vaginal bleeding.     OB History    Gravida Para Term Preterm AB Living   1             SAB TAB Ectopic Multiple Live Births                  Past Medical History:  Diagnosis Date  . Medical history non-contributory     Past Surgical History:  Procedure Laterality Date  . APPENDECTOMY      Family History  Problem Relation Age of Onset  . Cancer Neg Hx   . Kidney disease Neg Hx   . Heart disease Neg Hx     Social History  Substance Use Topics  . Smoking status: Never Smoker  . Smokeless tobacco: Never Used  . Alcohol use No    Allergies: No Known Allergies  Prescriptions Prior to Admission  Medication Sig Dispense Refill Last Dose  . Prenatal Vit-Fe Fumarate-FA (PRENATAL VITAMIN) 27-0.8 MG TABS Take 1 tablet by mouth daily. 30 tablet 11     Review of Systems  Constitutional: Positive for activity change (poor). Negative for chills and fever.  Gastrointestinal: Positive for abdominal pain. Negative for constipation, diarrhea, nausea and vomiting.  Genitourinary: Negative for vaginal bleeding and vaginal discharge.   Physical Exam   Blood pressure 106/65, pulse 77, temperature 98.6 F (37 C), temperature source Oral, resp. rate 18, height 5' (1.524 m), weight 112 lb (50.8 kg), last menstrual period 08/22/2016, SpO2 100 %.  Physical Exam  Constitutional: She is oriented to person, place, and time. She appears well-developed and well-nourished. No distress (appears comfortable).  HENT:  Head: Normocephalic and atraumatic.  Neck: Normal range of  motion.  Cardiovascular: Normal rate.   Respiratory: Effort normal. No respiratory distress.  GI: Soft. She exhibits no distension and no mass. There is no tenderness. There is no rebound and no guarding.  Musculoskeletal: Normal range of motion.  Neurological: She is alert and oriented to person, place, and time.  Skin: Skin is warm and dry.  Psychiatric: She has a normal mood and affect.   Results for orders placed or performed during the hospital encounter of 07/30/17 (from the past 24 hour(s))  CBC     Status: Abnormal   Collection Time: 07/30/17  8:42 PM  Result Value Ref Range   WBC 6.0 4.0 - 10.5 K/uL   RBC 4.18 3.87 - 5.11 MIL/uL   Hemoglobin 12.5 12.0 - 15.0 g/dL   HCT 40.935.7 (L) 81.136.0 - 91.446.0 %   MCV 85.4 78.0 - 100.0 fL   MCH 29.9 26.0 - 34.0 pg   MCHC 35.0 30.0 - 36.0 g/dL   RDW 78.212.6 95.611.5 - 21.315.5 %   Platelets 245 150 - 400 K/uL  hCG, quantitative, pregnancy     Status: Abnormal   Collection Time: 07/30/17  8:42 PM  Result Value Ref Range   hCG, Beta Chain, Quant, S 121,887 (H) <5 mIU/mL  Comprehensive metabolic panel     Status: Abnormal   Collection Time: 07/30/17  8:42 PM  Result Value Ref Range   Sodium 134 (L) 135 - 145 mmol/L   Potassium 3.5 3.5 - 5.1 mmol/L   Chloride 101 101 - 111 mmol/L   CO2 24 22 - 32 mmol/L   Glucose, Bld 85 65 - 99 mg/dL   BUN 13 6 - 20 mg/dL   Creatinine, Ser 1.61 0.44 - 1.00 mg/dL   Calcium 09.6 8.9 - 04.5 mg/dL   Total Protein 7.3 6.5 - 8.1 g/dL   Albumin 4.2 3.5 - 5.0 g/dL   AST 18 15 - 41 U/L   ALT 11 (L) 14 - 54 U/L   Alkaline Phosphatase 47 38 - 126 U/L   Total Bilirubin 0.8 0.3 - 1.2 mg/dL   GFR calc non Af Amer >60 >60 mL/min   GFR calc Af Amer >60 >60 mL/min   Anion gap 9 5 - 15  Lipase, blood     Status: None   Collection Time: 07/30/17  8:42 PM  Result Value Ref Range   Lipase 33 11 - 51 U/L  Urinalysis, Routine w reflex microscopic     Status: Abnormal   Collection Time: 07/30/17  8:50 PM  Result Value Ref Range    Color, Urine YELLOW YELLOW   APPearance CLEAR CLEAR   Specific Gravity, Urine 1.018 1.005 - 1.030   pH 6.0 5.0 - 8.0   Glucose, UA NEGATIVE NEGATIVE mg/dL   Hgb urine dipstick NEGATIVE NEGATIVE   Bilirubin Urine NEGATIVE NEGATIVE   Ketones, ur NEGATIVE NEGATIVE mg/dL   Protein, ur NEGATIVE NEGATIVE mg/dL   Nitrite NEGATIVE NEGATIVE   Leukocytes, UA TRACE (A) NEGATIVE   RBC / HPF 0-5 0 - 5 RBC/hpf   WBC, UA 0-5 0 - 5 WBC/hpf   Bacteria, UA NONE SEEN NONE SEEN   Squamous Epithelial / LPF 0-5 (A) NONE SEEN   Mucus PRESENT    MAU Course  Procedures  MDM Labs ordered and reviewed. No evidence of acute abdominal process. Sx could be caused by intolerance to foods worsened by early pregnancy. Stable for discharge home.  Assessment and Plan   1. Upper abdominal pain   2. [redacted] weeks gestation of pregnancy    Discharge home BRAT diet Follow up at [facility] to start care SAB precautions  Allergies as of 07/30/2017   No Known Allergies     Medication List    TAKE these medications   Prenatal Vitamin 27-0.8 MG Tabs Take 1 tablet by mouth daily.            Discharge Care Instructions        Start     Ordered   07/30/17 0000  Discharge patient    Question Answer Comment  Discharge disposition 01-Home or Self Care   Discharge patient date 07/30/2017      07/30/17 2237     Donette Larry, CNM 07/30/2017, 10:37 PM

## 2017-08-16 MED FILL — PRENATAL VITAMIN PLUS LOW I: 27-1 | 30 days supply | Qty: 30 | Fill #1

## 2017-09-12 LAB — OB RESULTS CONSOLE ABO/RH: RH TYPE: POSITIVE

## 2017-09-12 LAB — OB RESULTS CONSOLE HIV ANTIBODY (ROUTINE TESTING): HIV: NONREACTIVE

## 2017-09-12 LAB — OB RESULTS CONSOLE RUBELLA ANTIBODY, IGM: Rubella: IMMUNE

## 2017-09-12 LAB — OB RESULTS CONSOLE RPR: RPR: NONREACTIVE

## 2017-09-12 LAB — OB RESULTS CONSOLE GC/CHLAMYDIA
Chlamydia: NEGATIVE
Gonorrhea: NEGATIVE

## 2017-09-12 LAB — OB RESULTS CONSOLE ANTIBODY SCREEN: Antibody Screen: NEGATIVE

## 2017-09-12 LAB — OB RESULTS CONSOLE HEPATITIS B SURFACE ANTIGEN: HEP B S AG: NEGATIVE

## 2017-09-14 MED FILL — PRENATAL VITAMIN PLUS LOW I: 27-1 | 30 days supply | Qty: 30 | Fill #2

## 2017-10-16 MED FILL — PRENATAL VITAMIN PLUS LOW I: 27-1 | 30 days supply | Qty: 30 | Fill #3

## 2017-11-15 MED FILL — PRENATAL VITAMIN PLUS LOW I: 27-1 | 30 days supply | Qty: 30 | Fill #4

## 2017-12-13 MED FILL — PRENATAL VITAMIN PLUS LOW I: 27-1 | 30 days supply | Qty: 30 | Fill #5

## 2017-12-23 IMAGING — CT CT ABD-PELV W/ CM
2 of 4 series · 15 of 46 positions shown, 17 images · IV contrast (APPLIED)
Comparison: None.

ADDENDUM:
Discussion with the CT technologist indicates that there was a 90
minute delay between ingestion of contrast material and imaging. The
finding of gastric distention without progression of contrast
material into the duodenum over this time period suggests
gastroparesis or gastric outlet obstruction.
CLINICAL DATA: Diffuse abdominal pain for 2 days.

EXAM:
CT ABDOMEN AND PELVIS WITH CONTRAST
TECHNIQUE: Multidetector CT imaging of the abdomen and pelvis was performed
using the standard protocol following bolus administration of
intravenous contrast.
CONTRAST:  100mL YFRNYN-4CC IOPAMIDOL (YFRNYN-4CC) INJECTION 61%

[Series 2: axial st · axial · 0.65mm/px · z∈[-430,-20]mm · 12 of 90 slices shown, 14 images]
[im 4/90  soft-tissue]
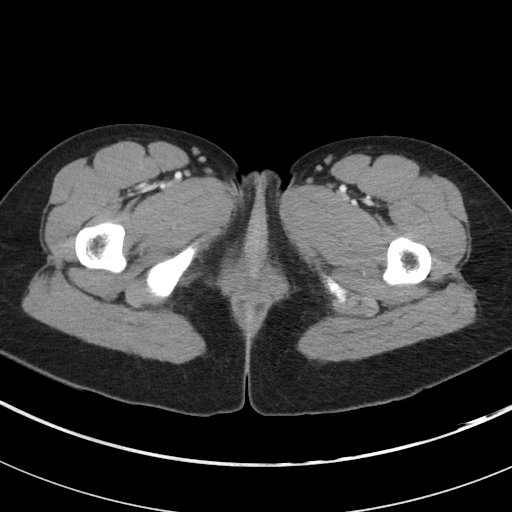
[im 4/90  bone]
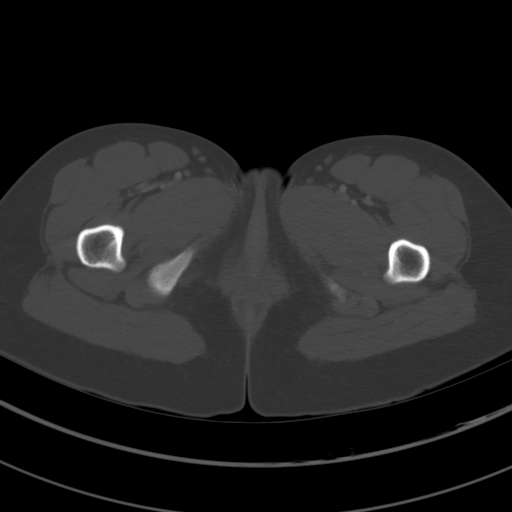
[im 12/90  soft-tissue]
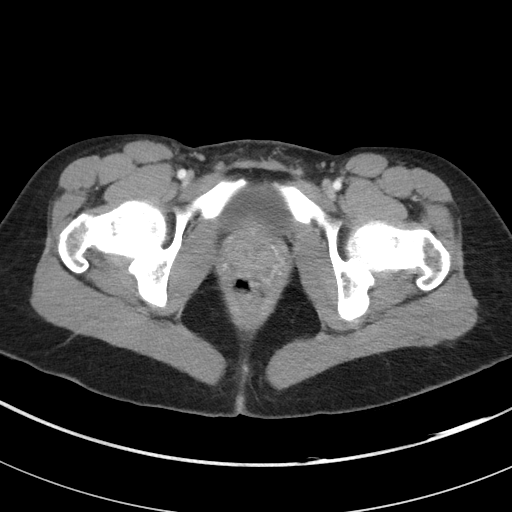
[im 19/90  soft-tissue]
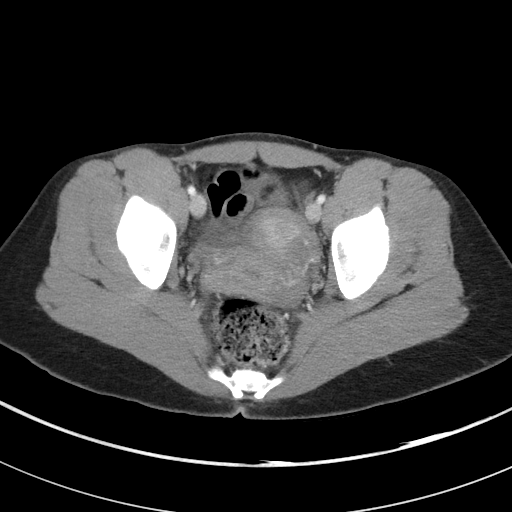
[im 26/90  soft-tissue]
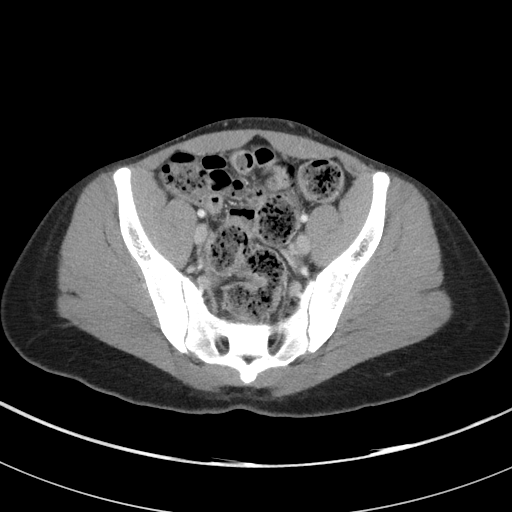
[im 34/90  soft-tissue]
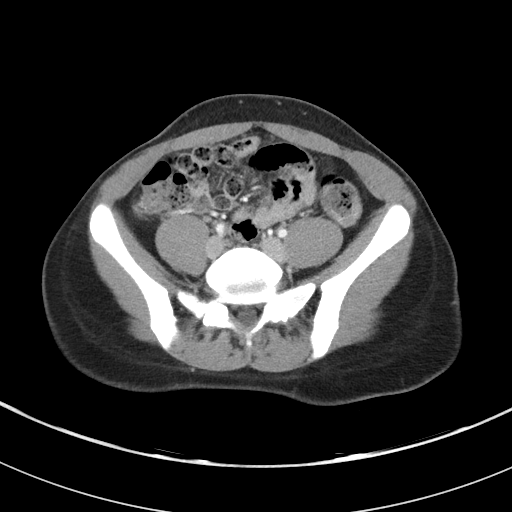
[im 41/90  soft-tissue]
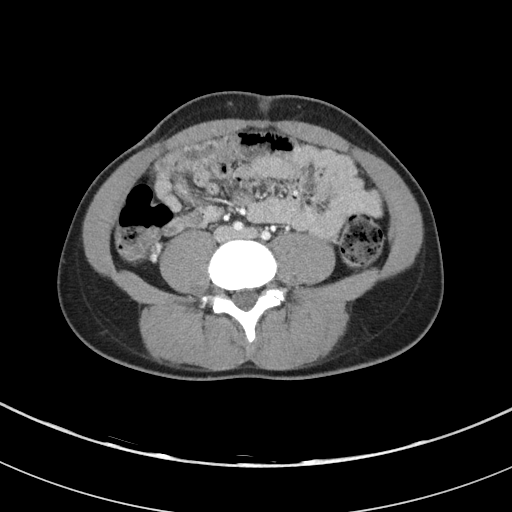
[im 49/90  soft-tissue]
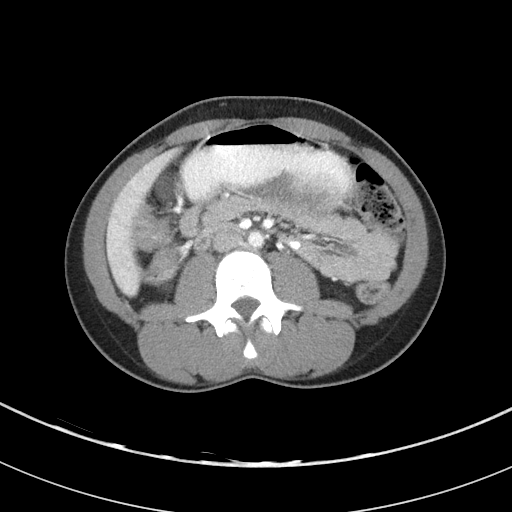
[im 56/90  soft-tissue]
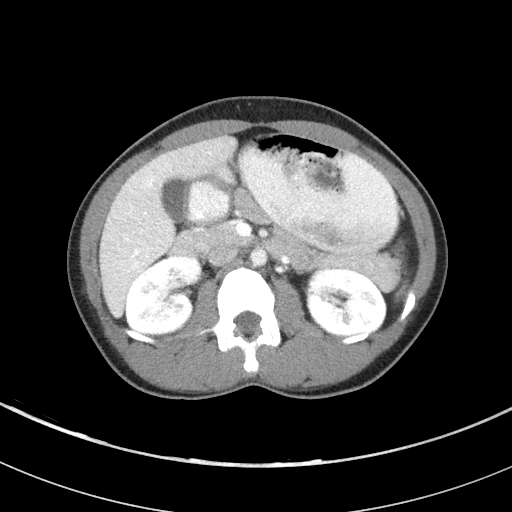
[im 64/90  soft-tissue]
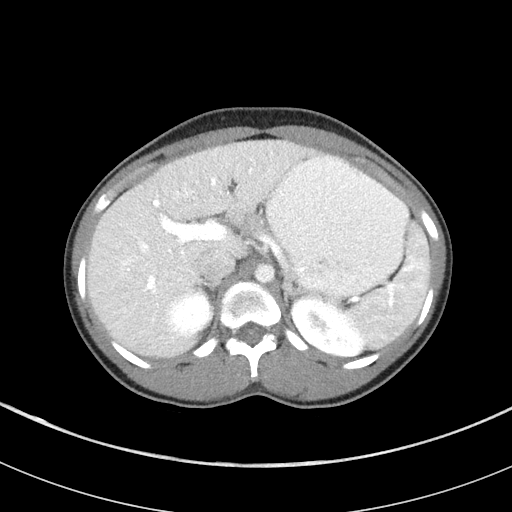
[im 64/90  bone]
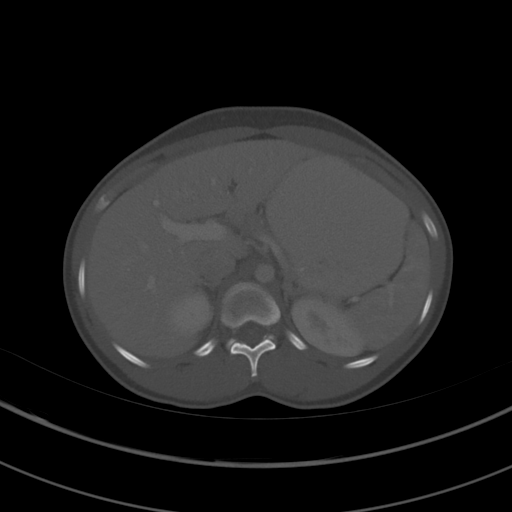
[im 71/90  soft-tissue]
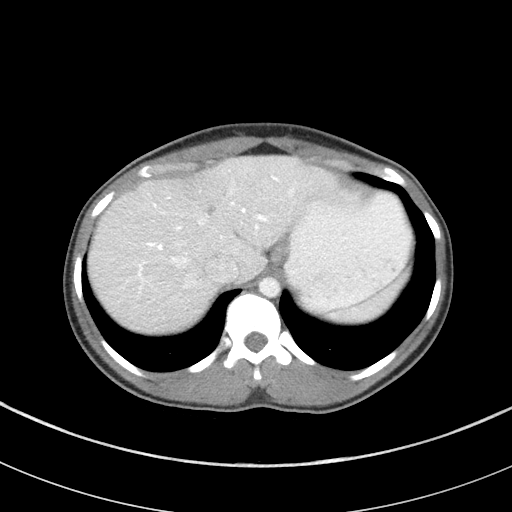
[im 78/90  soft-tissue]
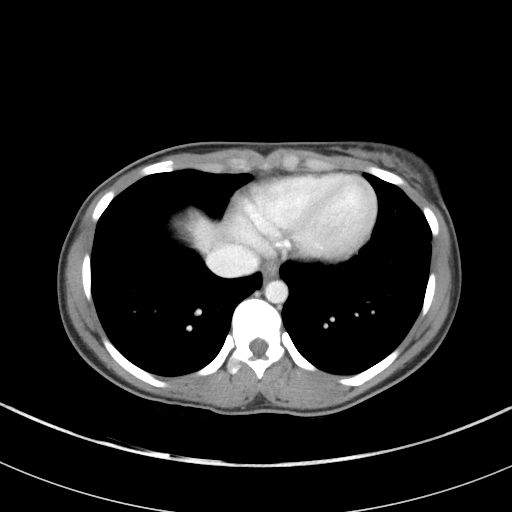
[im 86/90  soft-tissue]
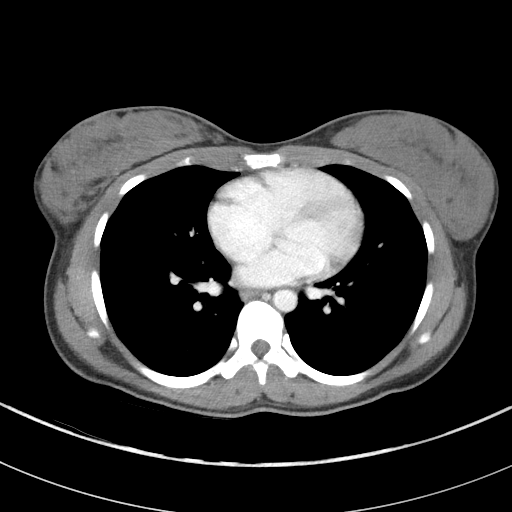

[Series 5: coronal st · coronal · 0.62mm/px · 3 of 64 slices shown]
[im 22/64  soft-tissue]
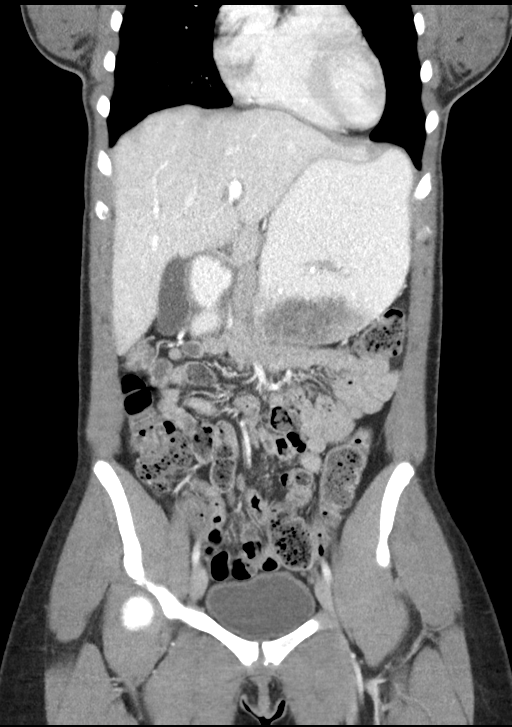
[im 29/64  soft-tissue]
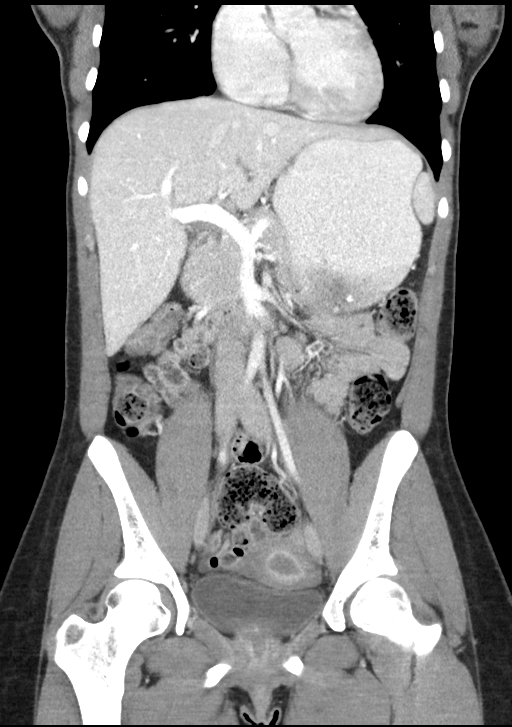
[im 36/64  soft-tissue]
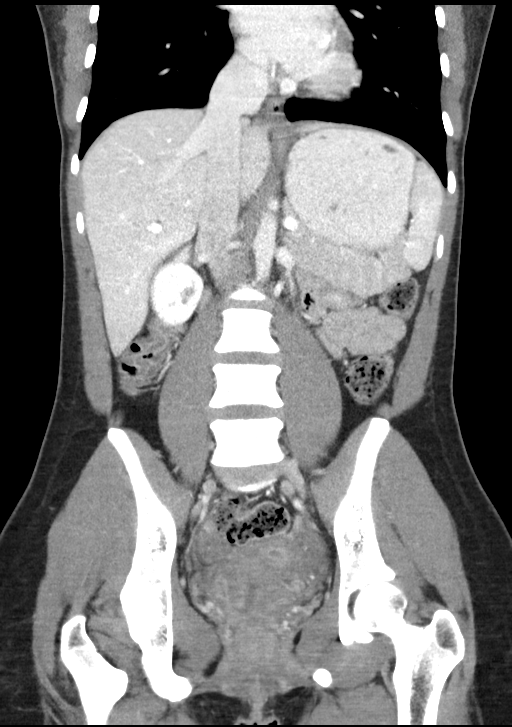

[15 of 46 positions shown; findings below may reference images not displayed]

FINDINGS: Lower chest: Lung bases are clear.

Hepatobiliary: No focal liver abnormality is seen. No gallstones,
gallbladder wall thickening, or biliary dilatation.

Pancreas: Unremarkable. No pancreatic ductal dilatation or
surrounding inflammatory changes.

Spleen: Normal in size without focal abnormality.

Adrenals/Urinary Tract: Adrenal glands are unremarkable. Kidneys are
normal, without renal calculi, focal lesion, or hydronephrosis.
Bladder is unremarkable.

Stomach/Bowel: Stomach is filled without wall thickening. Contrast
material has not yet passed out of the stomach. This may be due to
time of imaging after contrast ingestion but gastroparesis or
gastric outlet obstruction are not excluded. No mass lesions are
demonstrated. Small bowel are decompressed. Stool-filled colon
without abnormal distention or wall thickening. Appendix is normal.

Vascular/Lymphatic: No significant vascular findings are present. No
enlarged abdominal or pelvic lymph nodes.

Reproductive: Uterus is anteverted. Endometrial stripe thickness is
somewhat prominent, possibly physiologic. No abnormal adnexal
masses. Small amount of free fluid in the pelvis is likely
physiologic.

Other: No abdominal wall hernia or abnormality. No abdominopelvic
ascites.

Musculoskeletal: No acute or significant osseous findings.
IMPRESSION: Stomach is distended without wall thickening. Changes could be due
to insufficient delay after ingestion of contrast material but
gastroparesis or outlet obstruction are not excluded. No evidence of
small bowel dilatation. Diffusely stool-filled colon without
distention. Probable physiologic prominence of the endometrium.
Probable physiologic free fluid in the pelvis.

## 2018-01-16 MED FILL — PRENATAL VITAMIN PLUS LOW I: 27-1 | 30 days supply | Qty: 30 | Fill #6

## 2018-02-13 MED FILL — PRENATAL VITAMIN PLUS LOW I: 27-1 | 30 days supply | Qty: 30 | Fill #7

## 2018-02-26 LAB — OB RESULTS CONSOLE GBS: GBS: NEGATIVE

## 2018-03-16 MED FILL — PRENATAL VITAMIN PLUS LOW I: 27-1 | 30 days supply | Qty: 30 | Fill #8

## 2018-03-26 ENCOUNTER — Inpatient Hospital Stay (HOSPITAL_COMMUNITY)
Admission: AD | Admit: 2018-03-26 | Payer: Medicaid Other | Source: Ambulatory Visit | Admitting: Obstetrics and Gynecology

## 2018-03-29 ENCOUNTER — Telehealth (HOSPITAL_COMMUNITY): Payer: Self-pay | Admitting: *Deleted

## 2018-03-29 ENCOUNTER — Other Ambulatory Visit: Payer: Self-pay | Admitting: Obstetrics and Gynecology

## 2018-03-29 ENCOUNTER — Encounter (HOSPITAL_COMMUNITY): Payer: Self-pay | Admitting: *Deleted

## 2018-03-29 NOTE — Telephone Encounter (Signed)
Preadmission screen  

## 2018-03-30 ENCOUNTER — Institutional Professional Consult (permissible substitution): Payer: Self-pay | Admitting: Pediatrics

## 2018-04-02 ENCOUNTER — Encounter (HOSPITAL_COMMUNITY): Payer: Self-pay

## 2018-04-02 ENCOUNTER — Inpatient Hospital Stay (HOSPITAL_COMMUNITY): Payer: Medicaid Other | Admitting: Anesthesiology

## 2018-04-02 ENCOUNTER — Inpatient Hospital Stay (HOSPITAL_COMMUNITY)
Admission: RE | Admit: 2018-04-02 | Discharge: 2018-04-06 | DRG: 788 | Disposition: A | Discharge status: Home / Self Care | Payer: Medicaid Other | Source: Ambulatory Visit | Attending: Obstetrics | Admitting: Obstetrics

## 2018-04-02 ENCOUNTER — Other Ambulatory Visit: Payer: Self-pay

## 2018-04-02 DIAGNOSIS — O43123 Velamentous insertion of umbilical cord, third trimester: Secondary | ICD-10-CM | POA: Diagnosis present

## 2018-04-02 DIAGNOSIS — O480 Post-term pregnancy: Secondary | ICD-10-CM | POA: Diagnosis present

## 2018-04-02 DIAGNOSIS — Z3A41 41 weeks gestation of pregnancy: Secondary | ICD-10-CM | POA: Diagnosis not present

## 2018-04-02 LAB — TYPE AND SCREEN
ABO/RH(D): B POS
Antibody Screen: NEGATIVE

## 2018-04-02 LAB — CBC
HCT: 35.6 % — ABNORMAL LOW (ref 36.0–46.0)
Hemoglobin: 12 g/dL (ref 12.0–15.0)
MCH: 31.2 pg (ref 26.0–34.0)
MCHC: 33.7 g/dL (ref 30.0–36.0)
MCV: 92.5 fL (ref 78.0–100.0)
Platelets: 156 10*3/uL (ref 150–400)
RBC: 3.85 MIL/uL — ABNORMAL LOW (ref 3.87–5.11)
RDW: 12.6 % (ref 11.5–15.5)
WBC: 5.4 10*3/uL (ref 4.0–10.5)

## 2018-04-02 LAB — ABO/RH: ABO/RH(D): B POS

## 2018-04-02 LAB — RPR: RPR: NONREACTIVE

## 2018-04-02 MED ORDER — PHENYLEPHRINE 40 MCG/ML (10ML) SYRINGE FOR IV PUSH (FOR BLOOD PRESSURE SUPPORT)
PREFILLED_SYRINGE | INTRAVENOUS | Status: AC
  Filled 2018-04-02: qty 20

## 2018-04-02 MED ORDER — TERBUTALINE SULFATE 1 MG/ML IJ SOLN
0.2500 mg | Freq: Once | INTRAMUSCULAR | Status: DC | PRN
  Filled 2018-04-02: qty 1

## 2018-04-02 MED ORDER — OXYTOCIN 40 UNITS IN LACTATED RINGERS INFUSION - SIMPLE MED
1.0000 m[IU]/min | INTRAVENOUS | Status: DC
  Administered 2018-04-02: 1 m[IU]/min via INTRAVENOUS
  Administered 2018-04-02 (×3): 2 m[IU]/min via INTRAVENOUS
  Administered 2018-04-02: 1 m[IU]/min via INTRAVENOUS
  Administered 2018-04-02: 4 m[IU]/min via INTRAVENOUS

## 2018-04-02 MED ORDER — SOD CITRATE-CITRIC ACID 500-334 MG/5ML PO SOLN
30.0000 mL | ORAL | Status: DC | PRN
  Administered 2018-04-03: 30 mL via ORAL
  Filled 2018-04-02: qty 15

## 2018-04-02 MED ORDER — LIDOCAINE HCL (PF) 1 % IJ SOLN
30.0000 mL | INTRAMUSCULAR | Status: DC | PRN
  Filled 2018-04-02: qty 30

## 2018-04-02 MED ORDER — MISOPROSTOL 25 MCG QUARTER TABLET
25.0000 ug | ORAL_TABLET | ORAL | Status: DC | PRN
  Administered 2018-04-02 (×2): 25 ug via VAGINAL
  Filled 2018-04-02 (×3): qty 1

## 2018-04-02 MED ORDER — DIPHENHYDRAMINE HCL 50 MG/ML IJ SOLN: 12.5000 mg | INTRAMUSCULAR | Status: DC | PRN

## 2018-04-02 MED ORDER — TERBUTALINE SULFATE 1 MG/ML IJ SOLN: 0.2500 mg | Freq: Once | INTRAMUSCULAR | Status: DC | PRN

## 2018-04-02 MED ORDER — FENTANYL 2.5 MCG/ML BUPIVACAINE 1/10 % EPIDURAL INFUSION (WH - ANES)
INTRAMUSCULAR | Status: AC
  Filled 2018-04-02: qty 100

## 2018-04-02 MED ORDER — LACTATED RINGERS IV SOLN
INTRAVENOUS | Status: DC
  Administered 2018-04-02 – 2018-04-03 (×6): via INTRAVENOUS

## 2018-04-02 MED ORDER — LACTATED RINGERS IV SOLN
500.0000 mL | INTRAVENOUS | Status: DC | PRN
  Administered 2018-04-02 (×2): 500 mL via INTRAVENOUS
  Administered 2018-04-02 (×3): 250 mL via INTRAVENOUS

## 2018-04-02 MED ORDER — OXYTOCIN 40 UNITS IN LACTATED RINGERS INFUSION - SIMPLE MED
2.5000 [IU]/h | INTRAVENOUS | Status: DC
  Filled 2018-04-02: qty 1000

## 2018-04-02 MED ORDER — FENTANYL CITRATE (PF) 100 MCG/2ML IJ SOLN
100.0000 ug | INTRAMUSCULAR | Status: DC | PRN
  Administered 2018-04-02 (×3): 100 ug via INTRAVENOUS
  Filled 2018-04-02 (×4): qty 2

## 2018-04-02 MED ORDER — PHENYLEPHRINE 40 MCG/ML (10ML) SYRINGE FOR IV PUSH (FOR BLOOD PRESSURE SUPPORT)
80.0000 ug | PREFILLED_SYRINGE | INTRAVENOUS | Status: DC | PRN
  Administered 2018-04-02: 80 ug via INTRAVENOUS

## 2018-04-02 MED ORDER — LACTATED RINGERS IV SOLN: 500.0000 mL | Freq: Once | INTRAVENOUS | Status: DC

## 2018-04-02 MED ORDER — OXYTOCIN BOLUS FROM INFUSION: 500.0000 mL | Freq: Once | INTRAVENOUS | Status: DC

## 2018-04-02 MED ORDER — ACETAMINOPHEN 325 MG PO TABS: 650.0000 mg | ORAL_TABLET | ORAL | Status: DC | PRN

## 2018-04-02 MED ORDER — ONDANSETRON HCL 4 MG/2ML IJ SOLN: 4.0000 mg | Freq: Four times a day (QID) | INTRAMUSCULAR | Status: DC | PRN

## 2018-04-02 MED ORDER — EPHEDRINE 5 MG/ML INJ: 10.0000 mg | INTRAVENOUS | Status: DC | PRN

## 2018-04-02 MED ORDER — FENTANYL 2.5 MCG/ML BUPIVACAINE 1/10 % EPIDURAL INFUSION (WH - ANES)
14.0000 mL/h | INTRAMUSCULAR | Status: DC | PRN
  Administered 2018-04-02: 14 mL/h via EPIDURAL

## 2018-04-02 MED ORDER — PHENYLEPHRINE 40 MCG/ML (10ML) SYRINGE FOR IV PUSH (FOR BLOOD PRESSURE SUPPORT): 80.0000 ug | PREFILLED_SYRINGE | INTRAVENOUS | Status: DC | PRN

## 2018-04-02 MED ORDER — LIDOCAINE HCL (PF) 1 % IJ SOLN
INTRAMUSCULAR | Status: DC | PRN
  Administered 2018-04-02 (×2): 5 mL via EPIDURAL

## 2018-04-02 NOTE — H&P (Signed)
27 y.o. G1P0 @ [redacted]w[redacted]d presents for IOL for post-term pregnancy.  She received two doses of cytotec overnight.  Otherwise has good fetal movement and no bleeding.   Prenatal care has been uncomplicated.  She has a marginal cord insertion with normal third trimester fetal growth.   Past Medical History:  Diagnosis Date  . Medical history non-contributory     Past Surgical History:  Procedure Laterality Date  . APPENDECTOMY      OB History  Gravida Para Term Preterm AB Living  1            SAB TAB Ectopic Multiple Live Births               # Outcome Date GA Lbr Len/2nd Weight Sex Delivery Anes PTL Lv  1 Current             Social History   Socioeconomic History  . Marital status: Single    Spouse name: Not on file  . Number of children: Not on file  . Years of education: Not on file  . Highest education level: Not on file  Tobacco Use  . Smoking status: Never Smoker  . Smokeless tobacco: Never Used  Substance and Sexual Activity  . Alcohol use: No    Alcohol/week: 0.0 oz  . Drug use: No  . Sexual activity: Yes    Partners: Male    Birth control/protection: Condom  Social History Narrative   Lives with boyfriend and his mother (boyfriend is in [place])   No children    Works at the Emergency planning/management officer at [employer]   She is studying medical office administration at [employer] up in [place], moved here 3/13   Has cousins here but rest of family is back in [place]   Patient has no known allergies.    Prenatal Transfer Tool  Maternal Diabetes: No Genetic Screening: Normal Maternal Ultrasounds/Referrals: Normal Fetal Ultrasounds or other Referrals:  None Maternal Substance Abuse:  No Significant Maternal Medications:  None Significant Maternal Lab Results: Lab values include: Group B Strep negative  ABO, Rh: --/--/B POS, B POS Performed at [facility], [address] [place], [place] [address]  [phone]) Antibody: NEG (05/06 0110) Rubella: Immune (10/16 0000) RPR:  Nonreactive (10/16 0000)  HBsAg: Negative (10/16 0000)  HIV: Non-reactive (10/16 0000)  GBS: Negative (04/01 0000)       Vitals:   04/02/18 0706 04/02/18 0724  BP: 130/81 120/79  Pulse: 65 61  Resp:  20  Temp:  (!) 97.4 F (36.3 C)     General:  NAD Abdomen:  soft, gravid, EFW 6.5-7# Ex:  no edema SVE:  1/50/-3, FB placed and filled w 60cc fluid FHTs:  130, mod var, + accels, no decels Toco:  q2-5 min, irregular  3/19: [place] 5lb 8oz (54%)  A/P   27 y.o. G1P0 [redacted]w[redacted]d presents for IOL for post-term pregnancy Cervical ripening w FB + cytotec Epidural if patient requests  FSR/ vtx/ GBS neg  Sereen Schaff GEFFEL Kenley Rettinger

## 2018-04-02 NOTE — Anesthesia Procedure Notes (Signed)
Epidural Patient location during procedure: OB  Staffing Anesthesiologist: Gopal Malter, MD Performed: anesthesiologist   Preanesthetic Checklist Completed: patient identified, site marked, surgical consent, pre-op evaluation, timeout performed, IV checked, risks and benefits discussed and monitors and equipment checked  Epidural Patient position: sitting Prep: DuraPrep Patient monitoring: heart rate, continuous pulse ox and blood pressure Approach: right paramedian Location: L3-L4 Injection technique: LOR saline  Needle:  Needle type: Tuohy  Needle gauge: 17 G Needle length: 9 cm and 9 Needle insertion depth: 6 cm Catheter type: closed end flexible Catheter size: 20 Guage Catheter at skin depth: 10 cm Test dose: negative  Assessment Events: blood not aspirated, injection not painful, no injection resistance, negative IV test and no paresthesia  Additional Notes Patient identified. Risks/Benefits/Options discussed with patient including but not limited to bleeding, infection, nerve damage, paralysis, failed block, incomplete pain control, headache, blood pressure changes, nausea, vomiting, reactions to medication both or allergic, itching and postpartum back pain. Confirmed with bedside nurse the patient's most recent platelet count. Confirmed with patient that they are not currently taking any anticoagulation, have any bleeding history or any family history of bleeding disorders. Patient expressed understanding and wished to proceed. All questions were answered. Sterile technique was used throughout the entire procedure. Please see nursing notes for vital signs. Test dose was given through epidural needle and negative prior to continuing to dose epidural or start infusion. Warning signs of high block given to the patient including shortness of breath, tingling/numbness in hands, complete motor block, or any concerning symptoms with instructions to call for help. Patient was given  instructions on fall risk and not to get out of bed. All questions and concerns addressed with instructions to call with any issues.     

## 2018-04-02 NOTE — Anesthesia Preprocedure Evaluation (Signed)

## 2018-04-02 NOTE — Progress Notes (Signed)
Pt uncomfortable w contractions.  Did not like nitrous oxide.   Had 3-4 minute decelerations to the 90s and pitocin was discontinued  BP (!) 144/68 (BP Location: Right Arm)   Pulse 66   Temp 97.9 F (36.6 C) (Oral)   Resp 16   Ht 5' (1.524 m)   Wt 65.1 kg (143 lb 9.6 oz)   LMP 06/19/2017 (Exact Date)   BMI 28.04 kg/m   Toco: 4 min EFM: 120s, mod var SVE: 6/90/-2  A&P: g1 @ [redacted]w[redacted]d w IOL for post-term pregnancy Protracted active phase--pitocin has been decreased multiple times per RN for tachysystole IUPC placed--will titrate to adequate MVUs Suspect OP presentation--continue to attempt rotation with maternal position changes

## 2018-04-02 NOTE — Anesthesia Pain Management Evaluation Note (Signed)
  CRNA Pain Management Visit Note  Patient: Emily Gomez, 27 y.o., female  "Hello I am a member of the anesthesia team at [facility]. We have an anesthesia team available at all times to provide care throughout the hospital, including epidural management and anesthesia for C-section. I don't know your plan for the delivery whether it a natural birth, water birth, IV sedation, nitrous supplementation, doula or epidural, but we want to meet your pain goals."   1.Was your pain managed to your expectations on prior hospitalizations?   No prior hospitalizations  2.What is your expectation for pain management during this hospitalization?     Labor support without medications  3.How can we help you reach that goal? *support and availability if needed**  Record the patient's initial score and the patient's pain goal.   Pain: 5  Pain Goal: 9 [facility] wants you to be able to say your pain was always managed very well.  Trellis Paganini 04/02/2018

## 2018-04-03 ENCOUNTER — Encounter (HOSPITAL_COMMUNITY)
Admission: RE | Disposition: A | Discharge status: Home / Self Care | Payer: Self-pay | Source: Ambulatory Visit | Attending: Obstetrics

## 2018-04-03 ENCOUNTER — Encounter (HOSPITAL_COMMUNITY): Payer: Self-pay

## 2018-04-03 LAB — CBC
HCT: 33.3 % — ABNORMAL LOW (ref 36.0–46.0)
HEMOGLOBIN: 11.4 g/dL — AB (ref 12.0–15.0)
MCH: 31 pg (ref 26.0–34.0)
MCHC: 34.2 g/dL (ref 30.0–36.0)
MCV: 90.5 fL (ref 78.0–100.0)
PLATELETS: 169 10*3/uL (ref 150–400)
RBC: 3.68 MIL/uL — AB (ref 3.87–5.11)
RDW: 12.9 % (ref 11.5–15.5)
WBC: 16.2 10*3/uL — AB (ref 4.0–10.5)

## 2018-04-03 SURGERY — Surgical Case
Anesthesia: Epidural

## 2018-04-03 MED ORDER — DEXAMETHASONE SODIUM PHOSPHATE 4 MG/ML IJ SOLN
INTRAMUSCULAR | Status: DC | PRN
  Administered 2018-04-03: 4 mg via INTRAVENOUS

## 2018-04-03 MED ORDER — PRENATAL MULTIVITAMIN CH
1.0000 | ORAL_TABLET | Freq: Every day | ORAL | Status: DC
  Administered 2018-04-04 – 2018-04-06 (×3): 1 via ORAL
  Filled 2018-04-03 (×4): qty 1

## 2018-04-03 MED ORDER — SODIUM CHLORIDE 0.9 % IR SOLN
Status: DC | PRN
  Administered 2018-04-03: 1000 mL

## 2018-04-03 MED ORDER — SODIUM BICARBONATE 8.4 % IV SOLN
INTRAVENOUS | Status: DC | PRN
  Administered 2018-04-03 (×2): 5 mL via EPIDURAL

## 2018-04-03 MED ORDER — SCOPOLAMINE 1 MG/3DAYS TD PT72: 1.0000 | MEDICATED_PATCH | Freq: Once | TRANSDERMAL | Status: DC

## 2018-04-03 MED ORDER — MEPERIDINE HCL 25 MG/ML IJ SOLN
INTRAMUSCULAR | Status: AC
  Filled 2018-04-03: qty 1

## 2018-04-03 MED ORDER — ACETAMINOPHEN 500 MG PO TABS
1000.0000 mg | ORAL_TABLET | Freq: Four times a day (QID) | ORAL | Status: AC
  Administered 2018-04-03: 1000 mg via ORAL
  Filled 2018-04-03: qty 2

## 2018-04-03 MED ORDER — MORPHINE SULFATE (PF) 0.5 MG/ML IJ SOLN
INTRAMUSCULAR | Status: AC
  Filled 2018-04-03: qty 10

## 2018-04-03 MED ORDER — KETOROLAC TROMETHAMINE 30 MG/ML IJ SOLN: 30.0000 mg | Freq: Four times a day (QID) | INTRAMUSCULAR | Status: AC | PRN

## 2018-04-03 MED ORDER — ONDANSETRON HCL 4 MG/2ML IJ SOLN
INTRAMUSCULAR | Status: DC | PRN
  Administered 2018-04-03: 4 mg via INTRAVENOUS

## 2018-04-03 MED ORDER — NALOXONE HCL 4 MG/10ML IJ SOLN
1.0000 ug/kg/h | INTRAVENOUS | Status: DC | PRN
  Filled 2018-04-03: qty 5

## 2018-04-03 MED ORDER — LACTATED RINGERS IV SOLN
INTRAVENOUS | Status: DC
  Administered 2018-04-03: 13:00:00 via INTRAVENOUS

## 2018-04-03 MED ORDER — METOCLOPRAMIDE HCL 5 MG/ML IJ SOLN: 10.0000 mg | Freq: Once | INTRAMUSCULAR | Status: DC | PRN

## 2018-04-03 MED ORDER — DIBUCAINE 1 % RE OINT
1.0000 "application " | TOPICAL_OINTMENT | RECTAL | Status: DC | PRN
  Filled 2018-04-03: qty 28

## 2018-04-03 MED ORDER — OXYCODONE HCL 5 MG PO TABS
5.0000 mg | ORAL_TABLET | ORAL | Status: DC | PRN
  Administered 2018-04-04 – 2018-04-05 (×3): 5 mg via ORAL
  Filled 2018-04-03 (×3): qty 1

## 2018-04-03 MED ORDER — KETOROLAC TROMETHAMINE 30 MG/ML IJ SOLN
30.0000 mg | Freq: Four times a day (QID) | INTRAMUSCULAR | Status: AC | PRN
  Administered 2018-04-03: 30 mg via INTRAVENOUS
  Filled 2018-04-03: qty 1

## 2018-04-03 MED ORDER — ACETAMINOPHEN 325 MG PO TABS
650.0000 mg | ORAL_TABLET | ORAL | Status: DC | PRN
Start: 2018-04-03 — End: 2018-04-06
  Administered 2018-04-04: 650 mg via ORAL
  Filled 2018-04-03: qty 2

## 2018-04-03 MED ORDER — SCOPOLAMINE 1 MG/3DAYS TD PT72
MEDICATED_PATCH | TRANSDERMAL | Status: DC | PRN
  Administered 2018-04-03: 1 via TRANSDERMAL

## 2018-04-03 MED ORDER — MEPERIDINE HCL 25 MG/ML IJ SOLN
INTRAMUSCULAR | Status: DC | PRN
  Administered 2018-04-02 – 2018-04-03 (×2): 12.5 mg via INTRAVENOUS

## 2018-04-03 MED ORDER — OXYTOCIN 10 UNIT/ML IJ SOLN
INTRAVENOUS | Status: DC | PRN
  Administered 2018-04-03: 40 [IU] via INTRAVENOUS

## 2018-04-03 MED ORDER — MORPHINE SULFATE (PF) 0.5 MG/ML IJ SOLN
INTRAMUSCULAR | Status: DC | PRN
  Administered 2018-04-03: 3 mg via EPIDURAL

## 2018-04-03 MED ORDER — DIPHENHYDRAMINE HCL 25 MG PO CAPS
25.0000 mg | ORAL_CAPSULE | Freq: Four times a day (QID) | ORAL | Status: DC | PRN
  Filled 2018-04-03: qty 1

## 2018-04-03 MED ORDER — PHENYLEPHRINE HCL 10 MG/ML IJ SOLN
INTRAMUSCULAR | Status: DC | PRN
  Administered 2018-04-03 (×3): 80 ug via INTRAVENOUS

## 2018-04-03 MED ORDER — TETANUS-DIPHTH-ACELL PERTUSSIS 5-2.5-18.5 LF-MCG/0.5 IM SUSP: 0.5000 mL | Freq: Once | INTRAMUSCULAR | Status: DC

## 2018-04-03 MED ORDER — ONDANSETRON HCL 4 MG/2ML IJ SOLN
4.0000 mg | Freq: Three times a day (TID) | INTRAMUSCULAR | Status: DC | PRN
  Administered 2018-04-03: 4 mg via INTRAVENOUS
  Filled 2018-04-03 (×2): qty 2

## 2018-04-03 MED ORDER — CEFAZOLIN SODIUM-DEXTROSE 2-4 GM/100ML-% IV SOLN
2.0000 g | Freq: Once | INTRAVENOUS | Status: AC
Start: 2018-04-03 — End: 2018-04-03
  Administered 2018-04-03: 2 g via INTRAVENOUS

## 2018-04-03 MED ORDER — MEPERIDINE HCL 25 MG/ML IJ SOLN: 6.2500 mg | INTRAMUSCULAR | Status: DC | PRN

## 2018-04-03 MED ORDER — PHENYLEPHRINE 40 MCG/ML (10ML) SYRINGE FOR IV PUSH (FOR BLOOD PRESSURE SUPPORT)
PREFILLED_SYRINGE | INTRAVENOUS | Status: AC
  Filled 2018-04-03: qty 10

## 2018-04-03 MED ORDER — DIPHENHYDRAMINE HCL 50 MG/ML IJ SOLN: 12.5000 mg | INTRAMUSCULAR | Status: DC | PRN

## 2018-04-03 MED ORDER — SENNOSIDES-DOCUSATE SODIUM 8.6-50 MG PO TABS
2.0000 | ORAL_TABLET | ORAL | Status: DC
  Administered 2018-04-04 – 2018-04-06 (×2): 2 via ORAL
  Filled 2018-04-03 (×4): qty 2

## 2018-04-03 MED ORDER — LACTATED RINGERS IV SOLN: INTRAVENOUS | Status: DC

## 2018-04-03 MED ORDER — COCONUT OIL OIL
1.0000 "application " | TOPICAL_OIL | Status: DC | PRN
  Filled 2018-04-03: qty 120

## 2018-04-03 MED ORDER — NALBUPHINE HCL 10 MG/ML IJ SOLN: 5.0000 mg | Freq: Once | INTRAMUSCULAR | Status: DC | PRN

## 2018-04-03 MED ORDER — NALBUPHINE HCL 10 MG/ML IJ SOLN: 5.0000 mg | INTRAMUSCULAR | Status: DC | PRN

## 2018-04-03 MED ORDER — CEFAZOLIN SODIUM-DEXTROSE 2-4 GM/100ML-% IV SOLN
INTRAVENOUS | Status: AC
  Filled 2018-04-03: qty 100

## 2018-04-03 MED ORDER — NALOXONE HCL 0.4 MG/ML IJ SOLN: 0.4000 mg | INTRAMUSCULAR | Status: DC | PRN

## 2018-04-03 MED ORDER — SCOPOLAMINE 1 MG/3DAYS TD PT72
MEDICATED_PATCH | TRANSDERMAL | Status: AC
  Filled 2018-04-03: qty 1

## 2018-04-03 MED ORDER — DEXAMETHASONE SODIUM PHOSPHATE 4 MG/ML IJ SOLN
INTRAMUSCULAR | Status: AC
  Filled 2018-04-03: qty 1

## 2018-04-03 MED ORDER — DIPHENHYDRAMINE HCL 25 MG PO CAPS
25.0000 mg | ORAL_CAPSULE | ORAL | Status: DC | PRN
  Filled 2018-04-03: qty 1

## 2018-04-03 MED ORDER — OXYTOCIN 40 UNITS IN LACTATED RINGERS INFUSION - SIMPLE MED: 2.5000 [IU]/h | INTRAVENOUS | Status: AC

## 2018-04-03 MED ORDER — MENTHOL 3 MG MT LOZG
1.0000 | LOZENGE | OROMUCOSAL | Status: DC | PRN
  Filled 2018-04-03: qty 9

## 2018-04-03 MED ORDER — SODIUM CHLORIDE 0.9% FLUSH: 3.0000 mL | INTRAVENOUS | Status: DC | PRN

## 2018-04-03 MED ORDER — OXYTOCIN 10 UNIT/ML IJ SOLN
INTRAMUSCULAR | Status: AC
  Filled 2018-04-03: qty 4

## 2018-04-03 MED ORDER — SIMETHICONE 80 MG PO CHEW
80.0000 mg | CHEWABLE_TABLET | ORAL | Status: DC | PRN
  Filled 2018-04-03: qty 1

## 2018-04-03 MED ORDER — SIMETHICONE 80 MG PO CHEW
80.0000 mg | CHEWABLE_TABLET | ORAL | Status: DC
  Administered 2018-04-04 – 2018-04-06 (×3): 80 mg via ORAL
  Filled 2018-04-03 (×5): qty 1

## 2018-04-03 MED ORDER — SIMETHICONE 80 MG PO CHEW
80.0000 mg | CHEWABLE_TABLET | Freq: Three times a day (TID) | ORAL | Status: DC
  Administered 2018-04-03 – 2018-04-06 (×7): 80 mg via ORAL
  Filled 2018-04-03 (×10): qty 1

## 2018-04-03 MED ORDER — ONDANSETRON HCL 4 MG/2ML IJ SOLN
INTRAMUSCULAR | Status: AC
  Filled 2018-04-03: qty 2

## 2018-04-03 MED ORDER — OXYCODONE HCL 5 MG PO TABS: 10.0000 mg | ORAL_TABLET | ORAL | Status: DC | PRN

## 2018-04-03 MED ORDER — FENTANYL CITRATE (PF) 100 MCG/2ML IJ SOLN: 25.0000 ug | INTRAMUSCULAR | Status: DC | PRN

## 2018-04-03 MED ORDER — IBUPROFEN 600 MG PO TABS
600.0000 mg | ORAL_TABLET | Freq: Four times a day (QID) | ORAL | Status: DC
  Administered 2018-04-03 – 2018-04-06 (×11): 600 mg via ORAL
  Filled 2018-04-03 (×13): qty 1

## 2018-04-03 MED ORDER — WITCH HAZEL-GLYCERIN EX PADS: 1.0000 "application " | MEDICATED_PAD | CUTANEOUS | Status: DC | PRN

## 2018-04-03 SURGICAL SUPPLY — 35 items
BENZOIN TINCTURE PRP APPL 2/3 (GAUZE/BANDAGES/DRESSINGS) ×3 IMPLANT
CHLORAPREP W/TINT 26ML (MISCELLANEOUS) ×3 IMPLANT
CLAMP CORD UMBIL (MISCELLANEOUS) ×3 IMPLANT
CLOSURE WOUND 1/2 X4 (GAUZE/BANDAGES/DRESSINGS) ×1
CLOTH BEACON ORANGE TIMEOUT ST (SAFETY) ×3 IMPLANT
DRSG OPSITE POSTOP 4X10 (GAUZE/BANDAGES/DRESSINGS) ×3 IMPLANT
ELECT REM PT RETURN 9FT ADLT (ELECTROSURGICAL) ×3
ELECTRODE REM PT RTRN 9FT ADLT (ELECTROSURGICAL) ×1 IMPLANT
EXTRACTOR VACUUM KIWI (MISCELLANEOUS) IMPLANT
GLOVE BIOGEL PI IND STRL 6.5 (GLOVE) ×2 IMPLANT
GLOVE BIOGEL PI IND STRL 7.0 (GLOVE) ×4 IMPLANT
GLOVE BIOGEL PI INDICATOR 6.5 (GLOVE) ×4
GLOVE BIOGEL PI INDICATOR 7.0 (GLOVE) ×8
GLOVE ECLIPSE 6.0 STRL STRAW (GLOVE) ×3 IMPLANT
GOWN STRL REUS W/TWL LRG LVL3 (GOWN DISPOSABLE) ×6 IMPLANT
KIT ABG SYR 3ML LUER SLIP (SYRINGE) IMPLANT
NEEDLE HYPO 25X5/8 SAFETYGLIDE (NEEDLE) IMPLANT
NS IRRIG 1000ML POUR BTL (IV SOLUTION) ×3 IMPLANT
PACK C SECTION WH (CUSTOM PROCEDURE TRAY) ×3 IMPLANT
PAD OB MATERNITY 4.3X12.25 (PERSONAL CARE ITEMS) ×3 IMPLANT
PENCIL SMOKE EVAC W/HOLSTER (ELECTROSURGICAL) ×3 IMPLANT
RTRCTR C-SECT PINK 25CM LRG (MISCELLANEOUS) ×3 IMPLANT
STRIP CLOSURE SKIN 1/2X4 (GAUZE/BANDAGES/DRESSINGS) ×2 IMPLANT
SUT MNCRL 0 VIOLET CTX 36 (SUTURE) ×3 IMPLANT
SUT MNCRL AB 3-0 PS2 27 (SUTURE) ×3 IMPLANT
SUT MONOCRYL 0 CTX 36 (SUTURE) ×6
SUT PLAIN 0 NONE (SUTURE) IMPLANT
SUT PLAIN 2 0 (SUTURE) ×2
SUT PLAIN ABS 2-0 CT1 27XMFL (SUTURE) ×1 IMPLANT
SUT VIC AB 0 CTX 36 (SUTURE) ×4
SUT VIC AB 0 CTX36XBRD ANBCTRL (SUTURE) ×2 IMPLANT
SUT VIC AB 2-0 CT1 27 (SUTURE) ×2
SUT VIC AB 2-0 CT1 TAPERPNT 27 (SUTURE) ×1 IMPLANT
TOWEL OR 17X24 6PK STRL BLUE (TOWEL DISPOSABLE) ×3 IMPLANT
TRAY FOLEY W/BAG SLVR 14FR LF (SET/KITS/TRAYS/PACK) ×3 IMPLANT

## 2018-04-03 NOTE — Lactation Note (Signed)
This note was copied from a baby's chart. Lactation Consultation Note  Patient Name: Emily Gomez [id number]'[id number] Date: 04/03/2018 Reason for consult: Initial assessment;Primapara;Term Newborn is 38 hours old.  She has attempted to latch a few times but not showing much interest.  Mom has formula fed twice.  Assisted with postioning baby in football hold.  Nipple erect but tissue is difficult to compress.  Mom was able to hand express 2 large drops of colostrum.   Baby unable to grasp tissue so a 20 mm nipple shield applied.  Baby sucked a few times and then came off.  Relatched but no suck elicited.  Reassured mom.  Assisted with DEBP.  Instructed to pump every 3 hours x 15 minutes.  Report given to RN.  Maternal Data Has patient been taught Hand Expression?: Yes  Feeding Feeding Type: Breast Fed Length of feed: 0 min  LATCH Score Latch: Repeated attempts needed to sustain latch, nipple held in mouth throughout feeding, stimulation needed to elicit sucking reflex.  Audible Swallowing: None  Type of Nipple: Everted at rest and after stimulation  Comfort (Breast/Nipple): Soft / non-tender  Hold (Positioning): Assistance needed to correctly position infant at breast and maintain latch.  LATCH Score: 6  Interventions Interventions: Breast feeding basics reviewed;Assisted with latch;Breast compression;Skin to skin;Adjust position;Breast massage;Support pillows;Hand express;Position options;DEBP  Lactation Tools Discussed/Used Tools: Nipple Shields Nipple shield size: 20 Pump Review: Setup, frequency, and cleaning;Milk Storage Initiated by:: LM Date initiated:: 04/03/18   Consult Status      Emily Gomez S 04/03/2018, 11:29 AM

## 2018-04-03 NOTE — Transfer of Care (Signed)
Immediate Anesthesia Transfer of Care Note  Patient: Emily Gomez  Procedure(s) Performed: CESAREAN SECTION (N/A )  Patient Location: PACU  Anesthesia Type:Epidural  Level of Consciousness: awake and alert   Airway & Oxygen Therapy: Patient Spontanous Breathing  Post-op Assessment: Report given to RN, Post -op Vital signs reviewed and stable and Patient moving all extremities X 4  Post vital signs: Reviewed and stable  Last Vitals:  Vitals Value Taken Time  BP 139/108 04/03/2018  1:56 AM  Temp    Pulse 79 04/03/2018  1:57 AM  Resp 16 04/03/2018  1:57 AM  SpO2 99 % 04/03/2018  1:57 AM  Vitals shown include unvalidated device data.  Last Pain:  Vitals:   04/02/18 2347  TempSrc: Oral  PainSc:          Complications: No apparent anesthesia complications

## 2018-04-03 NOTE — Progress Notes (Signed)
POD#1 Pt doing well. Had some nausea. HGB- stable IMP/ Doing well.  Plan/ Routine care

## 2018-04-03 NOTE — Anesthesia Postprocedure Evaluation (Signed)
Anesthesia Post Note  Patient: Emily Gomez  Procedure(s) Performed: CESAREAN SECTION (N/A )     Patient location during evaluation: Mother Baby Anesthesia Type: Epidural Level of consciousness: awake and alert Pain management: pain level controlled Vital Signs Assessment: post-procedure vital signs reviewed and stable Respiratory status: spontaneous breathing, nonlabored ventilation and respiratory function stable Cardiovascular status: stable Postop Assessment: no headache, no backache, epidural receding, adequate PO intake, no apparent nausea or vomiting and patient able to bend at knees Anesthetic complications: no    Last Vitals:  Vitals:   04/03/18 0530 04/03/18 0654  BP: 113/79 133/60  Pulse: 66 76  Resp: 18 18  Temp: 36.8 C 37.1 C  SpO2: 97% 97%    Last Pain:  Vitals:   04/03/18 0654  TempSrc: Oral  PainSc: 0-No pain   Pain Goal:                 [employer]

## 2018-04-03 NOTE — Op Note (Signed)
Cesarean Section Procedure Note  PRE-OPERATIVE DIAGNOSIS:  Primary Cesarean Section for non-reassuring fetal status  POST-OPERATIVE DIAGNOSIS:  Primary Cesarean Section for non-reassuring fetal status  PROCEDURE:  Procedure(s): CESAREAN SECTION (N/A)  SURGEON:  Surgeon(s) and Role:    Marlow Baars, MD - Primary  ANESTHESIA:   epidural  EBL:  796 mL   BLOOD ADMINISTERED:none  DRAINS: none   LOCAL MEDICATIONS USED:  NONE  SPECIMEN:  Source of Specimen:  placenta to L&D  FINDINGS: Normal uterus, tubes and ovaries bilaterally.  Viable female infant, 3450g (7lb 9.7 oz) Apgars 10, 10.    Procedure Details   After epidural anesthesia was found to adequate , the patient was placed in the dorsal supine position with a leftward tilt, draped and prepped in the usual sterile manner. A Pfannenstiel incision was made and carried down through the subcutaneous tissue to the fascia. The fascia was incised in the midline and the fascial incision was extended laterally with Mayo scissors. The superior aspect of the fascial incision was grasped with two Kocher clamp, tented up and the rectus muscles dissected off sharply. The rectus was then dissected off with blunt dissection and Mayo scissors inferiorly. The rectus muscles were separated in the midline. The abdominal peritoneum was identified, tented up, entered bluntly, and the incision was extended superiorly and inferiorly with good visualization of the bladder. The Alexis retractor was deployed. The vesicouterine peritoneum was identified, tented up, entered sharply, and the bladder flap was created digitally. A scalpel was then used to make a low transverse incision on the uterus which was extended in the cephalad-caudad direction with blunt dissection. The fluid was clear. The fetal vertex was identified, elevated out of the pelvis and brought to the hysterotomy. The head was delivered easily followed by the shoulders and body. After a  60 second delay per protocol, the cord was clamped and cut and the infant was passed to the waiting neonatologist. The placenta was then delivered spontaneously, intact and appear normal, the uterus was cleared of all clot and debris   The hysterotomy was repaired with #0 Monocryl in running locked fashion.  A second imbricating layer of #0 Monocryl was placed.  A figure of eight suture was placed at the midportion of the hysterotomy, and excellent hemostasis was noted.  The Alexis retractor was removed from the abdomen. The peritoneum was examined and all vessels noted to be hemostatic. The abdominal cavity was cleared of all clot and debris.  The peritoneum was closed with 2-0 vicryl in a running fashion.  The rectus muscles were then closed with 2-0 Vicryl. The fascia and rectus muscles were inspected and were hemostatic. The fascia was closed with 0 Vicryl in a running fashion. The subcuticular layer was irrigated and all bleeders cauterized. The skin was closed with 3-0 monocryl in a subcuticular fashion. The incision was dressed with benzoine, steri strips and honeycomb dressing. All sponge lap and needle counts were correct x3. Patient tolerated the procedure well and recovered in stable condition following the procedure.

## 2018-04-03 NOTE — Brief Op Note (Signed)
04/02/2018 - 04/03/2018  1:42 AM  PATIENT:  Emily Gomez  27 y.o. female  PRE-OPERATIVE DIAGNOSIS:  Primary Cesarean Section for non-reassuring fetal status  POST-OPERATIVE DIAGNOSIS:  Primary Cesarean Section for non-reassuring fetal status  PROCEDURE:  Procedure(s): CESAREAN SECTION (N/A)  SURGEON:  Surgeon(s) and Role:    Marlow Baars, MD - Primary  ANESTHESIA:   epidural  EBL:  796 mL   BLOOD ADMINISTERED:none  DRAINS: none   LOCAL MEDICATIONS USED:  NONE  SPECIMEN:  Source of Specimen:  placenta to L&D  COUNTS:  YES  TOURNIQUET:  * No tourniquets in log *  DICTATION: .Note written in EPIC  PLAN OF CARE: Admit to inpatient   PATIENT DISPOSITION:  PACU - hemodynamically stable.   Delay start of Pharmacological VTE agent (>24hrs) due to surgical blood loss or risk of bleeding: not applicable

## 2018-04-03 NOTE — Progress Notes (Signed)
Prior to epidural placement, patient with intermittent decelerations that resolved quickly with repositioning.  Since epidural placement 1 hour ago, patient with repetitive, late decelerations.  BP is normal following epidural.  Decelerations have not resolved with maternal repositioning or discontinuation of pitocin.  SVE is unchanged at 6/90/-1 for approximately 6 hours as we have not been able to achieve an adequate contraction pattern or MVUs.  At this time, I recommend that we proceed with cesarean delivery due to non-reassuring fetal status.  Discussed risks with patient to include (but not limited to) infection, bleeding, damage to surrounding structures (bowel, bladder, tubes, ovaries, nerves, vessels, baby), need for blood transfusion, vte, need for additional procedures.  Consent signed, all questions answered.  Ancef  in OR.

## 2018-04-04 ENCOUNTER — Encounter (HOSPITAL_COMMUNITY): Payer: Self-pay | Admitting: Obstetrics

## 2018-04-04 NOTE — Lactation Note (Signed)
This note was copied from a baby's chart. Lactation Consultation Note  Patient Name: Emily Gomez RUEAV'W Date: 04/04/2018 Reason for consult: Follow-up assessment;Difficult latch Assisted mom with positioning baby in football hold.  Good flow of colostrum.  Baby latched with 20 mm nipple shield and suckled for 10 minutes.  No colostrum noted in shield after feeding.  Assisted mom with pumping and reviewed pump operation.  Instructed to put baby to breast with feeding cues and post pump x 15 minutes.  FOB bottle fed 10 mls of formula.  Encouraged to call out for assist prn.  Maternal Data    Feeding Feeding Type: Breast Fed Length of feed: 10 min  LATCH Score Latch: Repeated attempts needed to sustain latch, nipple held in mouth throughout feeding, stimulation needed to elicit sucking reflex.  Audible Swallowing: A few with stimulation  Type of Nipple: Everted at rest and after stimulation(short)  Comfort (Breast/Nipple): Soft / non-tender  Hold (Positioning): Assistance needed to correctly position infant at breast and maintain latch.  LATCH Score: 7  Interventions Interventions: Assisted with latch;Breast compression;Skin to skin;Adjust position;Breast massage;Support pillows;Hand express;Position options;DEBP  Lactation Tools Discussed/Used Tools: Nipple Shields Nipple shield size: 20   Consult Status Consult Status: Follow-up Date: 04/05/18 Follow-up type: In-patient    Huston Foley 04/04/2018, 11:28 AM

## 2018-04-04 NOTE — Plan of Care (Signed)
Patient is progressing well 

## 2018-04-04 NOTE — Lactation Note (Signed)
This note was copied from a baby's chart. Lactation Consultation Note  Patient Name: Emily Gomez UJWJX'B Date: 04/04/2018 Reason for consult: 1st time breastfeeding;Primapara;Follow-up assessment;Term;Difficult latch  Patient Request for LC Help:  Mother wants to latch infant to breast and is requesting assistance.  Mother has large breasts with short shaft nipples.  Infant was given a bottle 4 hours ago and took 19 mls.  When questioned about her feeding preference, mother stated she wants to breast/bottle feed.  I reminded her to always offer the breast first before the bottle and that baby can supplement in a manner that does not require an artificial nipple.    Reviewed breast massage and hand expression with a return demonstration by mother.  Colostrum drops easily expressed.  Attempted to latch infant on in the football hold on the right breast.  However, infant shows no interest in opening her mouth and no feeding cues.  She sucks tightly on my gloved finger.  She has a strong gag reflex.  Applied #20 NS to right breast and infant still made no attempt to latch and suck.  Lucien Mons Start used and put in NS with a curved tip syringe and baby began sucking.  She took a total of 6 mls this way and pulled off.  Attempted to latch again without success.  Finger fed 2 additional mls of Gerber Good Start while parents observed.    Encouraged mother to feed 8-12 times/24 hours or earlier if baby shows feeding cues.  Reminded mother to do breast massage and hand expression prior to latching on.  Encouraged mother to continue to practice awakening baby and taking clothes and swaddle off prior to latching.  The room is very hot and I turned down the heat with parents permission.  Parents will awaken baby at the 3 hour interval to feed if she has not self awakened.  Mother knows to post pump to help increase milk supply.  Showed father how to swaddle baby and placed her in bassinet.  Parents ready  for sleep and will call if further assistance is needed.  Feeding plan for the rest of the night established and parents verbalized understanding.      Maternal Data Formula Feeding for Exclusion: No Has patient been taught Hand Expression?: Yes Does the patient have breastfeeding experience prior to this delivery?: No  Feeding Feeding Type: Breast Fed Length of feed: 15 min  LATCH Score Latch: Repeated attempts needed to sustain latch, nipple held in mouth throughout feeding, stimulation needed to elicit sucking reflex.  Audible Swallowing: A few with stimulation  Type of Nipple: Everted at rest and after stimulation(short shaft)  Comfort (Breast/Nipple): Soft / non-tender  Hold (Positioning): Assistance needed to correctly position infant at breast and maintain latch.  LATCH Score: 7  Interventions Interventions: Breast feeding basics reviewed;Assisted with latch;Skin to skin;Breast massage;Hand express;Position options;Support pillows;Adjust position;Breast compression;DEBP  Lactation Tools Discussed/Used Tools: Nipple Shields Nipple shield size: 20   Consult Status Consult Status: Follow-up Date: 04/05/18 Follow-up type: In-patient    Jaiyon Wander R Malita Ignasiak 04/04/2018, 2:34 AM

## 2018-04-04 NOTE — Progress Notes (Signed)
Patient is eating, ambulating, voiding.  Pain control is good.  +flatus, no complaints.  Vitals:   04/03/18 1846 04/03/18 2250 04/04/18 0100 04/04/18 0547  BP: 109/89 98/62 (!) 98/48 (!) 100/54  Pulse: 72 74 70   Resp: Temp: 98.3 F (36.8 C) 98.4 F (36.9 C) 98.4 F (36.9 C) 98.5 F (36.9 C)  TempSrc: Oral Oral Oral Oral  SpO2: 100% 99% 99%   Weight:      Height:        Fundus firm Inc: c/d/i Ext: no calf tenderness  Lab Results  Component Value Date   WBC 16.2 (H) 04/03/2018   HGB 11.4 (L) 04/03/2018   HCT 33.3 (L) 04/03/2018   MCV 90.5 04/03/2018   PLT 169 04/03/2018    --/--/B POS, B POS Performed at [facility], [address] [place], [place] [address]  (05/06 0110)  A/P Post op day #1 s/p c/s for NRFHT Doing well, encouraged ambulation.  Routine care.  Expect d/c 5/9.    Philip Aspen

## 2018-04-04 NOTE — Plan of Care (Signed)
Patient is progressing well, pain is controlled

## 2018-04-05 NOTE — Progress Notes (Signed)
Patient is doing well.  She is tolerating PO, ambulating, voiding.  Pain is controlled.  Lochia is appropriate Desires to breastfeed--reports baby not latching well.  Received multiple bottles o/n.  Is able to hand express colostrum, but struggling w breastpump  Vitals:   04/04/18 0100 04/04/18 0547 04/04/18 1833 04/05/18 0621  BP: (!) 98/48 (!) 100/54 (!) 97/57 (!) 108/48  Pulse: 70  61 67  Resp: Temp: 98.4 F (36.9 C) 98.5 F (36.9 C) 98.3 F (36.8 C) 98.5 F (36.9 C)  TempSrc: Oral Oral Oral Oral  SpO2: 99%   100%  Weight:      Height:        NAD bdomen:  soft, appropriate tenderness, incisions intact and without erythema or drainage ext:    Symmetric, 1= edema bilaterally  Lab Results  Component Value Date   WBC 16.2 (H) 04/03/2018   HGB 11.4 (L) 04/03/2018   HCT 33.3 (L) 04/03/2018   MCV 90.5 04/03/2018   PLT 169 04/03/2018    --/--/B POS, B POS Performed at [facility], [address], [place], [place] [address]  (05/06 0110)/[place]  A/P    27 y.o. G1P1001 POD 2 s/p primary c/s for NRFS Routine post op and postpartum care.   Will continue to work on breastfeeding today ANticipate d/c tomorrow

## 2018-04-05 NOTE — Lactation Note (Signed)
This note was copied from a baby's chart. Lactation Consultation Note  Patient Name: Emily Gomez [id number] Date: 04/05/2018 Reason for consult: Follow-up assessment   P1, Baby 62 hours old. Mother states baby sucks and few times and does not maintain latch. Oral assessment indicated mid anterior short lingual frenulum. Suggest discussing with her Peds MD.  Mother's breasts are very full. Mother has been pumping good volume of transitional breastmilk. Reviewed hand expression and asitsted w/ latching baby with and without nipple shield. Baby sustained latch best with nipple shield and noted when unlatched that the nipple shield was full of transitional breastmilk. Encouraged mother to compress/massage breast during feeding. Mother will continue to pump every other feeding and give volume back to baby.       Maternal Data    Feeding Feeding Type: Breast Fed  LATCH Score Latch: Repeated attempts needed to sustain latch, nipple held in mouth throughout feeding, stimulation needed to elicit sucking reflex.  Audible Swallowing: A few with stimulation  Type of Nipple: Everted at rest and after stimulation  Comfort (Breast/Nipple): Filling, red/small blisters or bruises, mild/mod discomfort  Hold (Positioning): Assistance needed to correctly position infant at breast and maintain latch.  LATCH Score: 6  Interventions Interventions: Breast feeding basics reviewed;Assisted with latch;Skin to skin;Breast massage;Hand express;Breast compression;Adjust position;Support pillows;Position options;Expressed milk;Hand pump  Lactation Tools Discussed/Used Tools: Nipple Dorris Carnes;Pump Nipple shield size: 20;24   Consult Status Consult Status: Follow-up Date: 04/13/18 Follow-up type: In-patient    Emily Gomez [facility] 04/05/2018, 3:27 PM

## 2018-04-06 MED ORDER — IBUPROFEN 600 MG PO TABS: 600.0000 mg | ORAL_TABLET | Freq: Four times a day (QID) | ORAL | 0 refills | Status: DC

## 2018-04-06 MED ORDER — FERROUS GLUCONATE 324 (38 FE) MG PO TABS: 324.0000 mg | ORAL_TABLET | Freq: Every day | ORAL | 3 refills | Status: DC

## 2018-04-06 MED ORDER — OXYCODONE HCL 5 MG PO TABS: 5.0000 mg | ORAL_TABLET | ORAL | 0 refills | Status: DC | PRN

## 2018-04-06 MED FILL — IBUPROFEN 600 MG TABLET: 600 | 8 days supply | Qty: 30 | Fill #0

## 2018-04-06 MED FILL — FERROUS GLUCONATE 324 MG TA: 324 (38 FE) | 30 days supply | Qty: 30 | Fill #0

## 2018-04-06 NOTE — Lactation Note (Signed)
This note was copied from a baby's chart. Lactation Consultation Note  Patient Name: Emily Gomez UJWJX'B Date: 04/06/2018 Reason for consult: Follow-up assessment Baby is 79 hours and at a 3% weight loss.  Mom is putting baby to breast using nipple shield and states baby is doing much better.  Breasts are full. Reminded her to pump breasts anytime baby doesn't feed at breast or has a bottle.  Mom is interested in a [facility] loaner pump for discharge.  Lactation services and support reviewed and encouraged prn.  Maternal Data    Feeding    LATCH Score                   Interventions    Lactation Tools Discussed/Used     Consult Status Consult Status: Complete Follow-up type: Call as needed    Huston Foley 04/06/2018, 8:53 AM

## 2018-04-06 NOTE — Progress Notes (Signed)
POD#3 Pt states that breastfeeding has improved. Ready for discharge. Low B/p but ambulating with out difficulty and not tachy IMP/ stable Plan/ Discharge

## 2018-04-06 NOTE — Discharge Summary (Signed)
Obstetric Discharge Summary Reason for Admission: induction of labor Prenatal Procedures: NST and ultrasound Intrapartum Procedures: cesarean: low cervical, transverse Postpartum Procedures: none Complications-Operative and Postpartum: none Hemoglobin  Date Value Ref Range Status  04/03/2018 11.4 (L) 12.0 - 15.0 g/dL Final   HCT  Date Value Ref Range Status  04/03/2018 33.3 (L) 36.0 - 46.0 % Final    Physical Exam:  General: alert and cooperative Lochia: appropriate Uterine Fundus: firm Incision: healing well DVT Evaluation: No evidence of DVT seen on physical exam.  Discharge Diagnoses: Term Pregnancy-delivered, Post-date pregnancy and non reassuring FHTs  Discharge Information: Date: 04/06/2018 Activity: pelvic rest Diet: routine Medications: PNV, Ibuprofen, Iron and Percocet Condition: stable Instructions: refer to practice specific booklet Discharge to: home Follow-up Information    Marlow Baars, MD. Schedule an appointment as soon as possible for a visit in 1 month(s).   Specialty:  Obstetrics Contact information: [address] [place] [address] [phone]           Newborn Data: Live born female  Birth Weight: 7 lb 9.7 oz (3450 g) APGAR: 10, 10  Newborn Delivery   Birth date/time:  04/03/2018 01:08:00 Delivery type:  C-Section, Low Transverse C-section categorization:  Primary     Home with mother.  ANDERSON,MARK E 04/06/2018, 9:00 AM

## 2018-04-09 MED FILL — oxyCODONE HCL 5 MG TABS: 5 | 5 days supply | Qty: 30 | Fill #0

## 2018-04-11 ENCOUNTER — Other Ambulatory Visit: Payer: Self-pay

## 2018-04-11 ENCOUNTER — Inpatient Hospital Stay (HOSPITAL_COMMUNITY)
Admission: AD | Admit: 2018-04-11 | Discharge: 2018-04-13 | DRG: 776 | Disposition: A | Discharge status: Home / Self Care | Payer: Medicaid Other | Source: Ambulatory Visit | Attending: Obstetrics and Gynecology | Admitting: Obstetrics and Gynecology

## 2018-04-11 ENCOUNTER — Encounter (HOSPITAL_COMMUNITY): Payer: Self-pay | Admitting: *Deleted

## 2018-04-11 DIAGNOSIS — O1415 Severe pre-eclampsia, complicating the puerperium: Secondary | ICD-10-CM

## 2018-04-11 DIAGNOSIS — O1495 Unspecified pre-eclampsia, complicating the puerperium: Secondary | ICD-10-CM

## 2018-04-11 DIAGNOSIS — R030 Elevated blood-pressure reading, without diagnosis of hypertension: Secondary | ICD-10-CM | POA: Diagnosis not present

## 2018-04-11 HISTORY — DX: Unspecified pre-eclampsia, complicating the puerperium: O14.95

## 2018-04-11 LAB — COMPREHENSIVE METABOLIC PANEL WITH GFR
ALT: 18 U/L (ref 14–54)
AST: 32 U/L (ref 15–41)
Albumin: 3.5 g/dL (ref 3.5–5.0)
Alkaline Phosphatase: 191 U/L — ABNORMAL HIGH (ref 38–126)
Anion gap: 11 (ref 5–15)
BUN: 18 mg/dL (ref 6–20)
CO2: 22 mmol/L (ref 22–32)
Calcium: 9.3 mg/dL (ref 8.9–10.3)
Chloride: 106 mmol/L (ref 101–111)
Creatinine, Ser: 0.67 mg/dL (ref 0.44–1.00)
GFR calc Af Amer: >60 mL/min
GFR calc non Af Amer: >60 mL/min
Glucose, Bld: 83 mg/dL (ref 65–99)
Potassium: 4.7 mmol/L (ref 3.5–5.1)
Sodium: 139 mmol/L (ref 135–145)
Total Bilirubin: 0.6 mg/dL (ref 0.3–1.2)
Total Protein: 7.4 g/dL (ref 6.5–8.1)

## 2018-04-11 LAB — URINALYSIS, ROUTINE W REFLEX MICROSCOPIC
BILIRUBIN URINE: NEGATIVE
Bilirubin Urine: NEGATIVE
GLUCOSE, UA: NEGATIVE mg/dL
GLUCOSE, UA: NEGATIVE mg/dL
HGB URINE DIPSTICK: NEGATIVE
KETONES UR: NEGATIVE mg/dL
Ketones, ur: NEGATIVE mg/dL
Leukocytes, UA: NEGATIVE
NITRITE: NEGATIVE
Nitrite: NEGATIVE
PH: 5 (ref 5.0–8.0)
PH: 7 (ref 5.0–8.0)
Protein, ur: NEGATIVE mg/dL
Protein, ur: NEGATIVE mg/dL
RBC / HPF: >50 RBC/hpf — ABNORMAL HIGH (ref 0–5)
Specific Gravity, Urine: 1.021 (ref 1.005–1.030)
Specific Gravity, Urine: 1.005 (ref 1.005–1.030)

## 2018-04-11 LAB — PROTEIN / CREATININE RATIO, URINE
Creatinine, Urine: 134 mg/dL
Protein Creatinine Ratio: 0.08 mg/mg{creat} (ref 0.00–0.15)
Total Protein, Urine: 11 mg/dL

## 2018-04-11 LAB — CBC WITH DIFFERENTIAL/PLATELET
Basophils Absolute: 0 K/uL (ref 0.0–0.1)
Basophils Relative: 0 %
Eosinophils Absolute: 0.1 K/uL (ref 0.0–0.7)
Eosinophils Relative: 2 %
HCT: 34.5 % — ABNORMAL LOW (ref 36.0–46.0)
Hemoglobin: 11.6 g/dL — ABNORMAL LOW (ref 12.0–15.0)
Lymphocytes Relative: 29 %
Lymphs Abs: 1.8 K/uL (ref 0.7–4.0)
MCH: 31.4 pg (ref 26.0–34.0)
MCHC: 33.6 g/dL (ref 30.0–36.0)
MCV: 93.2 fL (ref 78.0–100.0)
Monocytes Absolute: 0.2 K/uL (ref 0.1–1.0)
Monocytes Relative: 3 %
Neutro Abs: 4 K/uL (ref 1.7–7.7)
Neutrophils Relative %: 66 %
Platelets: 311 K/uL (ref 150–400)
RBC: 3.7 MIL/uL — ABNORMAL LOW (ref 3.87–5.11)
RDW: 12.9 % (ref 11.5–15.5)
WBC: 6.1 K/uL (ref 4.0–10.5)

## 2018-04-11 MED ORDER — HYDRALAZINE HCL 20 MG/ML IJ SOLN: 10.0000 mg | Freq: Once | INTRAMUSCULAR | Status: DC | PRN

## 2018-04-11 MED ORDER — LABETALOL HCL 5 MG/ML IV SOLN
20.0000 mg | INTRAVENOUS | Status: DC | PRN
  Administered 2018-04-11: 40 mg via INTRAVENOUS

## 2018-04-11 MED ORDER — LACTATED RINGERS IV SOLN
INTRAVENOUS | Status: DC
  Administered 2018-04-11 – 2018-04-12 (×3): via INTRAVENOUS

## 2018-04-11 MED ORDER — MAGNESIUM SULFATE BOLUS VIA INFUSION
6.0000 g | Freq: Once | INTRAVENOUS | Status: AC
  Administered 2018-04-11: 6 g via INTRAVENOUS
  Filled 2018-04-11: qty 500

## 2018-04-11 MED ORDER — OXYCODONE-ACETAMINOPHEN 5-325 MG PO TABS
1.0000 | ORAL_TABLET | Freq: Four times a day (QID) | ORAL | Status: DC | PRN
  Administered 2018-04-12 – 2018-04-13 (×3): 1 via ORAL
  Filled 2018-04-11 (×3): qty 1

## 2018-04-11 MED ORDER — IBUPROFEN 600 MG PO TABS: 600.0000 mg | ORAL_TABLET | Freq: Four times a day (QID) | ORAL | Status: DC | PRN

## 2018-04-11 MED ORDER — LACTATED RINGERS IV SOLN
INTRAVENOUS | Status: DC
  Administered 2018-04-11: 16:00:00 via INTRAVENOUS

## 2018-04-11 MED ORDER — MAGNESIUM SULFATE 40 G IN LACTATED RINGERS - SIMPLE
2.0000 g/h | INTRAVENOUS | Status: DC
  Administered 2018-04-11 – 2018-04-12 (×2): 2 g/h via INTRAVENOUS
  Filled 2018-04-11 (×2): qty 40

## 2018-04-11 MED ORDER — LABETALOL HCL 5 MG/ML IV SOLN
20.0000 mg | INTRAVENOUS | Status: DC | PRN
  Administered 2018-04-11: 20 mg via INTRAVENOUS
  Filled 2018-04-11: qty 4
  Filled 2018-04-11: qty 8

## 2018-04-11 NOTE — MAU Note (Signed)
Pt had C/S on May 7, was seen by a home nurse today, was told her BP was elevated & to come to the hospital.  Has slight HA in the morning but it always goes away, denies visual changes.  Edema noted in feet.

## 2018-04-11 NOTE — H&P (Signed)
Shaquasha Gerstel is a 27 y.o. female presenting for elevated BPs.  27 yo G1P1 s/p primary cesarean section on 04/03/2018 presents at the recommendation of the smart start nurse for evaluation for elevated BPs. The patient had normotensive BPs during her pregnancy. During a home visit by a home health nurse today she was noted to have severe range blood pressures. In MAU, severe range BPs persist and the decision was made to admit for PP Pre-E OB History    Gravida  1   Para  1   Term  1   Preterm      AB      Living  1     SAB      TAB      Ectopic      Multiple  0   Live Births  1          Past Medical History:  Diagnosis Date  . Medical history non-contributory    Past Surgical History:  Procedure Laterality Date  . APPENDECTOMY    . CESAREAN SECTION N/A 04/03/2018   Procedure: CESAREAN SECTION;  Surgeon: Marlow Baars, MD;  Location: [facility] BIRTHING SUITES;  Service: Obstetrics;  Laterality: N/A;   Family History: family history is not on file. Social History:  reports that she has never smoked. She has never used smokeless tobacco. She reports that she does not drink alcohol or use drugs.       ROS History   Blood pressure (!) 160/94, pulse 65, temperature 98.4 F (36.9 C), temperature source Oral, resp. rate 18, weight 58.6 kg (129 lb 1.9 oz), unknown if currently breastfeeding. Exam Physical Exam  Prenatal labs: ABO, Rh: --/--/B POS, B POS Performed at [facility], [address] [place], [place] [address]  [phone] 0110) Antibody: NEG (05/06 0110) Rubella: Immune (10/16 0000) RPR: Non Reactive (05/06 0110)  HBsAg: Negative (10/16 0000)  HIV: Non-reactive (10/16 0000)  GBS: Negative (04/01 0000)   Assessment/Plan: 1) Admit 2) Labetalol protocol 3) Magnesium for seizure prophylaxis 4) SCDs for DVT prophylaxis   Waynard Reeds 04/11/2018, 4:23 PM

## 2018-04-11 NOTE — Progress Notes (Signed)
PT sleeping

## 2018-04-11 NOTE — Progress Notes (Signed)
Offered patient breast pump. Patient refused for now.

## 2018-04-11 NOTE — MAU Provider Note (Signed)
History     CSN: [id number]  Arrival date and time: 04/11/18 1503   First Provider Initiated Contact with Patient 04/11/18 1558      Chief Complaint  Patient presents with  . Hypertension   Hypertension  This is a new problem. The current episode started today. The problem has been gradually worsening since onset. Pertinent negatives include no blurred vision, headaches or shortness of breath. Associated agents: Pregnancy  Past treatments include nothing. There are no compliance problems.     [name] is a 27 y.o. female G1P1001 status post cesarean section on 5/7 due to fetal distress here with elevated BP. Says the home health nurse was out to her house today and sent her here due to elevated BP at home. She says she has not had any problems with her BP up until now. No scotoma or HA.   OB History    Gravida  1   Para  1   Term  1   Preterm      AB      Living  1     SAB      TAB      Ectopic      Multiple  0   Live Births  1           Past Medical History:  Diagnosis Date  . Medical history non-contributory     Past Surgical History:  Procedure Laterality Date  . APPENDECTOMY    . CESAREAN SECTION N/A 04/03/2018   Procedure: CESAREAN SECTION;  Surgeon: Marlow Baars, MD;  Location: [facility] BIRTHING SUITES;  Service: Obstetrics;  Laterality: N/A;    Family History  Problem Relation Age of Onset  . Cancer Neg Hx   . Kidney disease Neg Hx   . Heart disease Neg Hx     Social History   Tobacco Use  . Smoking status: Never Smoker  . Smokeless tobacco: Never Used  Substance Use Topics  . Alcohol use: No    Alcohol/week: 0.0 oz  . Drug use: No    Allergies: No Known Allergies  Medications Prior to Admission  Medication Sig Dispense Refill Last Dose  . ferrous gluconate (FERGON) 324 MG tablet Take 1 tablet (324 mg total) by mouth daily with breakfast. 30 tablet 3   . ibuprofen (ADVIL,MOTRIN) 600 MG tablet Take 1 tablet (600 mg total) by  mouth every 6 (six) hours. 30 tablet 0   . oxyCODONE (OXY IR/ROXICODONE) 5 MG immediate release tablet Take 1 tablet (5 mg total) by mouth every 4 (four) hours as needed (pain scale 4-7). 30 tablet 0    Results for orders placed or performed during the hospital encounter of 04/11/18 (from the past 48 hour(s))  Urinalysis, Routine w reflex microscopic     Status: Abnormal   Collection Time: 04/11/18  3:15 PM  Result Value Ref Range   Color, Urine YELLOW YELLOW   APPearance HAZY (A) CLEAR   Specific Gravity, Urine 1.021 1.005 - 1.030   pH 5.0 5.0 - 8.0   Glucose, UA NEGATIVE NEGATIVE mg/dL   Hgb urine dipstick LARGE (A) NEGATIVE   Bilirubin Urine NEGATIVE NEGATIVE   Ketones, ur NEGATIVE NEGATIVE mg/dL   Protein, ur NEGATIVE NEGATIVE mg/dL   Nitrite NEGATIVE NEGATIVE   Leukocytes, UA MODERATE (A) NEGATIVE   RBC / HPF >50 (H) 0 - 5 RBC/hpf   WBC, UA 21-50 0 - 5 WBC/hpf   Bacteria, UA RARE (A) NONE SEEN   Mucus  PRESENT     Comment: Performed at [facility], [address], [place], [place] [address]   Review of Systems  Constitutional: Negative for fever.  Eyes: Negative for blurred vision and photophobia.  Respiratory: Negative for shortness of breath.   Gastrointestinal: Negative for abdominal pain.  Genitourinary: Positive for vaginal bleeding.  Neurological: Negative for dizziness and headaches.   Physical Exam   Blood pressure (!) 168/98, pulse 68, temperature 98.4 F (36.9 C), temperature source Oral, resp. rate 18, weight 129 lb 1.9 oz (58.6 kg), unknown if currently breastfeeding.   Patient Vitals for the past 24 hrs:  BP Temp Temp src Pulse Resp Weight  04/11/18 1601 (!) 160/94 - - 65 - -  04/11/18 1546 (!) 168/98 - - 68 - -  04/11/18 1531 (!) 142/93 98.4 F (36.9 C) Oral 68 - -  04/11/18 1522 (!) 153/95 - - 64 18 -  04/11/18 1517 - - - - - 129 lb 1.9 oz (58.6 kg)    Physical Exam  Constitutional: She is oriented to person, place, and time. She appears  well-developed and well-nourished. No distress.  HENT:  Head: Normocephalic.  Eyes: Pupils are equal, round, and reactive to light.  Cardiovascular: Normal rate.  Respiratory: Effort normal.  GI: Soft. She exhibits no distension. There is no tenderness. There is no rebound.  Musculoskeletal: Normal range of motion.  Neurological: She is alert and oriented to person, place, and time. She displays normal reflexes.  Negative clonus   Skin: Skin is warm. She is not diaphoretic.  Psychiatric: Her behavior is normal.   MAU Course  Procedures  None   MDM  Pre E labs draw LR IV established Labetalol protocol initiated due to severe range BP readings Discussed HPI, labs, BP readings with Dr. Tenny Craw, recommend admission for magnesium sulfate. Dr. Tenny Craw to place admission orders at this time.   Assessment and Plan   A:  1. Severe pre-eclampsia, postpartum condition or complication     P:  Admit to 3rd floor Magnesium sulfate Labetalol IV given in MAU Labs pending  Duane Lope, NP 04/11/2018 4:23 PM

## 2018-04-12 LAB — COMPREHENSIVE METABOLIC PANEL
ALT: 15 U/L (ref 14–54)
ANION GAP: 11 (ref 5–15)
AST: 15 U/L (ref 15–41)
Albumin: 3.2 g/dL — ABNORMAL LOW (ref 3.5–5.0)
Alkaline Phosphatase: 180 U/L — ABNORMAL HIGH (ref 38–126)
BUN: 17 mg/dL (ref 6–20)
CHLORIDE: 105 mmol/L (ref 101–111)
CO2: 23 mmol/L (ref 22–32)
Calcium: 7.5 mg/dL — ABNORMAL LOW (ref 8.9–10.3)
Creatinine, Ser: 0.7 mg/dL (ref 0.44–1.00)
GFR calc Af Amer: >60 mL/min (ref >60)
GFR calc non Af Amer: >60 mL/min (ref >60)
Glucose, Bld: 102 mg/dL — ABNORMAL HIGH (ref 65–99)
POTASSIUM: 4 mmol/L (ref 3.5–5.1)
SODIUM: 139 mmol/L (ref 135–145)
Total Bilirubin: 0.3 mg/dL (ref 0.3–1.2)
Total Protein: 7.4 g/dL (ref 6.5–8.1)

## 2018-04-12 LAB — CBC
HCT: 33.4 % — ABNORMAL LOW (ref 36.0–46.0)
Hemoglobin: 11 g/dL — ABNORMAL LOW (ref 12.0–15.0)
MCH: 30.4 pg (ref 26.0–34.0)
MCHC: 32.9 g/dL (ref 30.0–36.0)
MCV: 92.3 fL (ref 78.0–100.0)
PLATELETS: 283 10*3/uL (ref 150–400)
RBC: 3.62 MIL/uL — AB (ref 3.87–5.11)
RDW: 12.6 % (ref 11.5–15.5)
WBC: 5.5 10*3/uL (ref 4.0–10.5)

## 2018-04-12 MED ORDER — PROMETHAZINE HCL 25 MG/ML IJ SOLN: 25.0000 mg | Freq: Four times a day (QID) | INTRAMUSCULAR | Status: DC | PRN

## 2018-04-12 MED ORDER — GI COCKTAIL ~~LOC~~
30.0000 mL | Freq: Two times a day (BID) | ORAL | Status: DC | PRN
  Administered 2018-04-12: 30 mL via ORAL
  Filled 2018-04-12 (×2): qty 30

## 2018-04-12 NOTE — Progress Notes (Signed)
This mother was readmitted for high BP. Reports baby has been nursing well at home with NS. Was offered pump yesterday but refused. Breasts are full today but not engorged. Has asked for pump now and NT is going to find her one. Encouraged to pump q 3 hours if baby is not here to nurse. She is not sure ir baby is coming to visit.Plans to call [facility] about a pump for home.  No questions at present. To call prn

## 2018-04-12 NOTE — Progress Notes (Signed)
  Patient is eating, ambulating, voiding.  Pain control is good.  Vitals:   04/12/18 0200 04/12/18 0300 04/12/18 0400 04/12/18 0425  BP:   124/87 127/81  Pulse:   79 79  Resp: Temp:    98.5 F (36.9 C)  TempSrc:    Oral  SpO2: 100% 99% 98% 99%  Weight:      Height:        lungs:   clear to auscultation cor:    RRR Abdomen:  soft, appropriate tenderness, incisions intact and without erythema or exudate ex:    no cords   Lab Results  Component Value Date   WBC 5.5 04/12/2018   HGB 11.0 (L) 04/12/2018   HCT 33.4 (L) 04/12/2018   MCV 92.3 04/12/2018   PLT 283 04/12/2018     A/P    Post operative day 9 with pp preeclampsia.  Pt readmitted for magnesium sulfate for seizure prophylaxis; started at 16:30 yesterday.  Will continue for 24 hours.  Percocet for pain control. BPs initally controlled with meds but now in normal range without.

## 2018-04-13 LAB — MAGNESIUM: Magnesium: 6.2 mg/dL (ref 1.7–2.4)

## 2018-04-13 NOTE — Progress Notes (Signed)
Discharge instructions given to patient. Discussed signs of postpartum hypertension. No changes in medicine. Discussed follow up appointments. Paperwork signed and IV removed at this time.

## 2018-04-13 NOTE — Discharge Instructions (Signed)
You may wash incision with soap and water.   °Do not soak the incision for 2 weeks (no tub baths or swimming).   °Keep incision dry. You may need to keep a sanitary pad or panty liner between the incision and your clothing for comfort and to keep the incision dry.  If you note drainage, increased pain, or increased redness of the incision, then please notify your physician. ° °Pelvic rest x 6 weeks (no intercourse or tampons)  ° °No lifting over 10 lbs for 6 weeks.  ° °Do not drive until you are not taking narcotic pain medication AND you can comfortably slam on the brakes. ° ° ° °

## 2018-04-13 NOTE — Discharge Summary (Signed)
Physician Discharge Summary  Patient ID: Emily Gomez MRN: [id number] DOB/AGE: 01-13-1991 27 y.o.  Admit date: 04/11/2018 Discharge date: 04/13/2018  Admission Diagnoses:  Discharge Diagnoses:  Active Problems:   Pre-eclampsia in puerperium   Discharged Condition: good  Hospital Course: 27 yo G1P1 s/p primary cesarean section on 04/03/2018 presents at the recommendation of the smart start nurse for evaluation for elevated BPs. The patient had normotensive BPs during her pregnancy. During a home visit by a home health nurse today she was noted to have severe range blood pressures. In MAU, severe range BPs persist and the decision was made to admit for PP Pre-E  At admission, she required two doses of IV labetalol to decrease BPs from the severe ranges.  She was started on magnesium sulfate for seizure ppx for 24 hours.  This was discontinued yesterday at 1630.   She has not required any additional medication (either IV or PO) for control of BP since admission.  She had a slight headache today that was resolved with pain meds.  She denies visual changes or RUQ pain.  LFTs / Creatinine / platelets were normal on admission and again yesterday.  BPs have been at her baseline 110/60-70s since discontinuing the magnesium sulfate.  She is otherwise doing well.    Has diuresed significantly since admission, with net -3L  Will follow up for BP check in 3 days.  Home smart start nurse will also see for repeat BP check.  Warning signs reviewed.    Treatments: magnesium sulfate  Discharge Exam: Blood pressure 138/79, pulse 72, temperature 98 F (36.7 C), resp. rate 18, height 5' (1.524 m), weight 58.5 kg (129 lb), SpO2 100 %, unknown if currently breastfeeding.   Disposition: Discharge disposition: 01-Home or Self Care       Discharge Instructions    Call MD for:  difficulty breathing, headache or visual disturbances   Complete by:  As directed    Call MD for:  extreme fatigue   Complete  by:  As directed    Call MD for:  hives   Complete by:  As directed    Call MD for:  persistant dizziness or light-headedness   Complete by:  As directed    Call MD for:  persistant nausea and vomiting   Complete by:  As directed    Call MD for:  redness, tenderness, or signs of infection (pain, swelling, redness, odor or green/yellow discharge around incision site)   Complete by:  As directed    Call MD for:  severe uncontrolled pain   Complete by:  As directed    Call MD for:  temperature >100.4   Complete by:  As directed    No dressing needed   Complete by:  As directed      Allergies as of 04/13/2018   No Known Allergies     Medication List    TAKE these medications   docusate sodium 100 MG capsule Commonly known as:  COLACE Take 100 mg by mouth daily.   ferrous gluconate 324 MG tablet Commonly known as:  FERGON Take 1 tablet (324 mg total) by mouth daily with breakfast.   ibuprofen 600 MG tablet Commonly known as:  ADVIL,MOTRIN Take 1 tablet (600 mg total) by mouth every 6 (six) hours.   oxyCODONE 5 MG immediate release tablet Commonly known as:  Oxy IR/ROXICODONE Take 1 tablet (5 mg total) by mouth every 4 (four) hours as needed (pain scale 4-7).  Follow-up Information    Marlow Baars, MD Follow up in 4 day(s).   Specialty:  Obstetrics Why:  Blood pressure check at [facility] in 4 days.  If the smart start nurse is available to check your BP on Monday or Tuesday, please schedule an appointment with [place] on Thursday or Friday Contact information: [address] [place] [address] [phone]           Signed: Carmelina Dane GEFFEL Yennifer Segovia 04/13/2018, 5:02 PM

## 2018-04-13 NOTE — Progress Notes (Addendum)
Patient is doing well. She was readmitted for concern for severe preeclampsia two days ago due to severe range BPs.  At admission, she required two doses of IV labetalol to decrease BPs from the severe ranges.  She was started on magnesium sulfate for seizure ppx for 24 hours.  This was discontinued yesterday at 1630.   She has not required any additional medication (either IV or PO) for control of BP since admission.  She had a slight headache today that was resolved with pain meds.  She denies visual changes or RUQ pain.  LFTs / Creatinine / platelets were normal on admission and again yesterday.  BPs have been at her baseline 110/60-70s since discontinuing the magnesium sulfate.  She is otherwise doing well.    Has diuresed significantly since admission, with net -3L   She is tolerating PO, ambulating, voiding.  Pain is controlled.  Lochia is appropriate  Vitals:   04/12/18 1919 04/12/18 2333 04/13/18 0400 04/13/18 0824  BP: 113/68 116/77 124/81 110/78  Pulse: 79 74 64 70  Resp: Temp: 98.6 F (37 C) 98.6 F (37 C) 98.5 F (36.9 C) 98.4 F (36.9 C)  TempSrc: Oral Oral Oral Oral  SpO2: 99% 100% 98% 99%  Weight:      Height:          NAD Abdomen:  soft, appropriate tenderness, incisions intact and without erythema or drainage ext:    Symmetric, trace lower extremity edema bilaterally  Lab Results  Component Value Date   WBC 5.5 04/12/2018   HGB 11.0 (L) 04/12/2018   HCT 33.4 (L) 04/12/2018   MCV 92.3 04/12/2018   PLT 283 04/12/2018    --/--/B POS, B POS Performed at [facility], [address], [place], [place] [address]  (05/06 0110)  A/P    27 y.o. G1P1001 POD 10 s/p primary CD with postpartum hypertension / preeclampsia Magnesium discontinued 12 hr ago. BPs have been normal and not requiring po anti-hypertensive.  Will monitor until roughly 24 hours off of magnesium.  If bps remain normal with d/c to home.  Will plan short interval f/u in office.  Smart  start nurse also available for home f/u next week per pt.

## 2020-06-11 DIAGNOSIS — L910 Hypertrophic scar: Secondary | ICD-10-CM | POA: Insufficient documentation

## 2020-06-12 ENCOUNTER — Encounter: Payer: Self-pay | Admitting: Family Medicine

## 2020-06-12 ENCOUNTER — Telehealth (INDEPENDENT_AMBULATORY_CARE_PROVIDER_SITE_OTHER): Payer: No Typology Code available for payment source | Admitting: Family Medicine

## 2020-06-12 ENCOUNTER — Telehealth: Payer: Medicaid Other | Admitting: Family

## 2020-06-12 VITALS — Temp 97.8°F | Ht 60.0 in

## 2020-06-12 DIAGNOSIS — R0982 Postnasal drip: Secondary | ICD-10-CM

## 2020-06-12 DIAGNOSIS — R05 Cough: Secondary | ICD-10-CM | POA: Diagnosis not present

## 2020-06-12 DIAGNOSIS — R059 Cough, unspecified: Secondary | ICD-10-CM

## 2020-06-12 MED ORDER — CHERATUSSIN AC 100-10 MG/5ML PO SOLN: 5.0000 mL | Freq: Every evening | ORAL | 0 refills | Status: DC | PRN

## 2020-06-12 MED ORDER — BENZONATATE 100 MG PO CAPS: 100.0000 mg | ORAL_CAPSULE | Freq: Three times a day (TID) | ORAL | 0 refills | Status: DC | PRN

## 2020-06-12 NOTE — Patient Instructions (Signed)
Drink plenty of water! Take mucinex or mucinex DM 1 tab twice per day x 1 week Use nasal saline spray 2-3 sprays each side of nose then blow nose at least 2x/day Take claritin or zyrtec 1 tab daily x 1 week cheratussin cough syrup 47mL at bedtime as needed for cough - may make you sleepy Tessalon 100mg  1 cap 3x/day as needed for cough - will not make you sleepy

## 2020-06-12 NOTE — Progress Notes (Signed)
Virtual Visit via Video Note  I connected with Emily Gomez on 06/12/20 at  1:30 PM EDT by a video enabled telemedicine application and verified that I am speaking with the correct person using two identifiers. Location patient: home Location provider: work  Persons participating in the virtual visit: patient, provider  I discussed the limitations of evaluation and management by telemedicine and the availability of in person appointments. The patient expressed understanding and agreed to proceed.  Chief Complaint  Patient presents with  . Sore Throat    Pt c/o sore throat started on Monday.  Pt tried tea w/no relief, she is losing her voice and ther is pain w/coughing.     HPI: Emily Gomez is a 29 y.o. female who complains of sore throat since Monday (x 4 days). + cough  Throat is painful when she coughs. No fever, chills. No runny nose or nasal congestion. + PND and throat-clearing cough. Voice is hoarse x 3 days. Cough is worse at night and keeping her up. She is drinking hot tea.     Past Medical History:  Diagnosis Date  . Medical history non-contributory     Past Surgical History:  Procedure Laterality Date  . APPENDECTOMY    . CESAREAN SECTION N/A 04/03/2018   Procedure: CESAREAN SECTION;  Surgeon: Marlow Baars, MD;  Location: [facility] BIRTHING SUITES;  Service: Obstetrics;  Laterality: N/A;    Family History  Problem Relation Age of Onset  . Cancer Neg Hx   . Kidney disease Neg Hx   . Heart disease Neg Hx     Social History   Tobacco Use  . Smoking status: Never Smoker  . Smokeless tobacco: Never Used  Substance Use Topics  . Alcohol use: No    Alcohol/week: 0.0 standard drinks  . Drug use: No     Current Outpatient Medications:  .  docusate sodium (COLACE) 100 MG capsule, Take 100 mg by mouth daily., Disp: , Rfl:  .  ibuprofen (ADVIL,MOTRIN) 600 MG tablet, Take 1 tablet (600 mg total) by mouth every 6 (six) hours., Disp: 30 tablet, Rfl: 0  No Known  Allergies    ROS: See pertinent positives and negatives per HPI.   EXAM:  VITALS per patient if applicable: Temp 97.8 F (36.6 C) (Oral)   Ht 5' (1.524 m)   LMP 06/07/2020   BMI 25.19 kg/m    GENERAL: alert, oriented, appears well and in no acute distress  HEENT: atraumatic, conjunctiva clear, no obvious abnormalities on inspection of external nose and ears; hoarse voice  NECK: normal movements of the head and neck  LUNGS: on inspection no signs of respiratory distress, breathing rate appears normal, no obvious gross SOB, gasping or wheezing, no conversational dyspnea  CV: no obvious cyanosis  PSYCH/NEURO: pleasant and cooperative, speech and thought processing grossly intact   ASSESSMENT AND PLAN: 1. PND (post-nasal drip) 2. Cough - increase water intake - nasal saline spray 2-3x/day - mucinex BID x 1 week - claritin or zyrtec 1 tab daily x 1 week Rx: - guaiFENesin-codeine (CHERATUSSIN AC) 100-10 MG/5ML syrup; Take 5 mLs by mouth at bedtime as needed for cough.  Dispense: 120 mL; Refill: 0 - benzonatate (TESSALON) 100 MG capsule; Take 1 capsule (100 mg total) by mouth 3 (three) times daily as needed for cough.  Dispense: 30 capsule; Refill: 0 - f/u if symptoms worsen or do not improve in 7-10 days Discussed plan and reviewed medications with patient, including risks, benefits, and potential side effects.  Pt expressed understand. All questions answered.     I discussed the assessment and treatment plan with the patient. The patient was provided an opportunity to ask questions and all were answered. The patient agreed with the plan and demonstrated an understanding of the instructions.   The patient was advised to call back or seek an in-person evaluation if the symptoms worsen or if the condition fails to improve as anticipated.   Luana Shu, DO

## 2021-10-01 LAB — RESULTS CONSOLE HPV: CHL HPV: NEGATIVE

## 2021-10-01 LAB — HM PAP SMEAR

## 2021-10-06 LAB — HM PAP SMEAR

## 2022-08-25 ENCOUNTER — Encounter: Payer: Self-pay | Admitting: Nurse Practitioner

## 2022-08-25 ENCOUNTER — Ambulatory Visit (INDEPENDENT_AMBULATORY_CARE_PROVIDER_SITE_OTHER): Payer: No Typology Code available for payment source | Admitting: Nurse Practitioner

## 2022-08-25 VITALS — BP 110/70 | HR 66 | Temp 97.4°F | Ht 60.0 in | Wt 134.4 lb

## 2022-08-25 DIAGNOSIS — J069 Acute upper respiratory infection, unspecified: Secondary | ICD-10-CM | POA: Diagnosis not present

## 2022-08-25 LAB — POCT INFLUENZA A/B
Influenza A, POC: NEGATIVE
Influenza B, POC: NEGATIVE

## 2022-08-25 LAB — POC COVID19 BINAXNOW: SARS Coronavirus 2 Ag: NEGATIVE

## 2022-08-25 NOTE — Progress Notes (Signed)
Established Patient Visit  Patient: Emily Gomez   DOB: [birth year]   31 y.o. Female  MRN: [id number] Visit Date: 08/25/2022  Subjective:    Chief Complaint  Patient presents with   Establish Care    New pt, est care C/o stuffy nose, weakness x 5 days  Hasn't taken a covid or flu test    URI  This is a new problem. The current episode started in the past 7 days. The problem has been unchanged. There has been no fever. The fever has been present for Less than 1 day. Associated symptoms include congestion, headaches, joint pain, rhinorrhea and sinus pain. Pertinent negatives include no abdominal pain, chest pain, coughing, diarrhea, dysuria, ear pain, joint swelling, nausea, neck pain, plugged ear sensation, rash, sneezing, sore throat, swollen glands, vomiting or wheezing. She has tried antihistamine for the symptoms. The treatment provided no relief.   Reviewed medical, surgical, and social history today  Medications: Outpatient Medications Prior to Visit  Medication Sig   [DISCONTINUED] benzonatate (TESSALON) 100 MG capsule Take 1 capsule (100 mg total) by mouth 3 (three) times daily as needed for cough. (Patient not taking: Reported on 08/25/2022)   [DISCONTINUED] docusate sodium (COLACE) 100 MG capsule Take 100 mg by mouth daily. (Patient not taking: Reported on 08/25/2022)   [DISCONTINUED] guaiFENesin-codeine (CHERATUSSIN AC) 100-10 MG/5ML syrup Take 5 mLs by mouth at bedtime as needed for cough. (Patient not taking: Reported on 08/25/2022)   [DISCONTINUED] ibuprofen (ADVIL,MOTRIN) 600 MG tablet Take 1 tablet (600 mg total) by mouth every 6 (six) hours. (Patient not taking: Reported on 08/25/2022)   No facility-administered medications prior to visit.   Reviewed past medical and social history.   ROS per HPI above      Objective:  BP 110/70 (BP Location: Right Arm, Patient Position: Sitting, Cuff Size: Small)   Pulse 66   Temp (!) 97.4 F (36.3 C) (Temporal)   Ht  5' (1.524 m)   Wt 134 lb 6.4 oz (61 kg)   LMP 08/18/2022 (Exact Date) Comment: condom  SpO2 99%   Breastfeeding No   BMI 26.25 kg/m      Physical Exam Vitals reviewed.  Cardiovascular:     Rate and Rhythm: Normal rate and regular rhythm.     Pulses: Normal pulses.     Heart sounds: Normal heart sounds.  Pulmonary:     Effort: Pulmonary effort is normal.     Breath sounds: Normal breath sounds.  Musculoskeletal:     Cervical back: Normal range of motion and neck supple.     Right lower leg: No edema.     Left lower leg: No edema.  Lymphadenopathy:     Cervical: No cervical adenopathy.  Neurological:     Mental Status: She is alert and oriented to person, place, and time.     Results for orders placed or performed in visit on 08/25/22  POC COVID-19  Result Value Ref Range   SARS Coronavirus 2 Ag Negative Negative  POCT Influenza A/B  Result Value Ref Range   Influenza A, POC Negative Negative   Influenza B, POC Negative Negative      Assessment & Plan:    Problem List Items Addressed This Visit   None Visit Diagnoses     Viral upper respiratory tract infection    -  Primary   Relevant Orders   POC COVID-19 (Completed)   POCT Influenza  A/B (Completed)      Return in about 4 weeks (around 09/22/2022) for CPE (fasting).     Wilfred Lacy, NP

## 2022-08-25 NOTE — Patient Instructions (Addendum)
URI Instructions: Flonase and Afrin use: apply 1spray of afrin in each nare, wait 48mins, then apply 2sprays of flonase in each nare. Use both nasal spray consecutively x 3days, then flonase only for at least 14days.  Encourage adequate oral hydration.  Use over-the-counter  "cold" medicines  such as "Tylenol cold" or "Advil cold"  for congestion.  Use" Delsym" or" Robitussin" cough syrup for cough.  You can use plain "Tylenol" or "Advil" for fever, chills and achyness. Do not combine with tynol cold or advil cold.   "Common cold" symptoms are usually triggered by a virus.  The antibiotics are usually not necessary. On average, a" viral cold" illness may take 7-10 days to resolve. Please, make an appointment if you are not better or if you're worse.

## 2022-10-27 ENCOUNTER — Ambulatory Visit (INDEPENDENT_AMBULATORY_CARE_PROVIDER_SITE_OTHER): Payer: No Typology Code available for payment source | Admitting: Nurse Practitioner

## 2022-10-27 ENCOUNTER — Encounter: Payer: Self-pay | Admitting: Nurse Practitioner

## 2022-10-27 VITALS — BP 114/74 | HR 75 | Temp 97.8°F | Ht 60.0 in | Wt 133.4 lb

## 2022-10-27 DIAGNOSIS — Z13220 Encounter for screening for lipoid disorders: Secondary | ICD-10-CM | POA: Diagnosis not present

## 2022-10-27 DIAGNOSIS — E010 Iodine-deficiency related diffuse (endemic) goiter: Secondary | ICD-10-CM | POA: Diagnosis not present

## 2022-10-27 DIAGNOSIS — R1031 Right lower quadrant pain: Secondary | ICD-10-CM

## 2022-10-27 DIAGNOSIS — Z136 Encounter for screening for cardiovascular disorders: Secondary | ICD-10-CM

## 2022-10-27 DIAGNOSIS — G8929 Other chronic pain: Secondary | ICD-10-CM | POA: Insufficient documentation

## 2022-10-27 DIAGNOSIS — Z0001 Encounter for general adult medical examination with abnormal findings: Secondary | ICD-10-CM

## 2022-10-27 NOTE — Assessment & Plan Note (Signed)
intermittent, associated with right groin pain worse with strain or sudden movement or beginning of menstrual cycle. Improves with rest. S/p appendectomy.Marland Kitchen  Possibly related to hip joint vs ovaries vs scar tissue? Advised about need for pelvic US. She declined at this time.

## 2022-10-27 NOTE — Addendum Note (Signed)
Addended by: Varney Biles on: 10/27/2022 03:03 PM   Modules accepted: Orders

## 2022-10-27 NOTE — Progress Notes (Signed)
Complete physical exam  Patient: Emily Gomez   DOB: 15-Jan-1991   31 y.o. Female  MRN: [id number] Visit Date: 10/27/2022  Subjective:    Chief Complaint  Patient presents with  . Annual Exam    CPE Pt fasting  Requesting records for pap No concerns    Emily Gomez is a 31 y.o. female who presents today for a complete physical exam. She reports consuming a general diet.  Cardio and weight traing 3x/week  She generally feels well. She reports sleeping well. She does not have additional problems to discuss today.  Vision:Yes Dental:Yes STD Screen:No  Most recent fall risk assessment:     No data to display          Depression screen:Yes - No Depression  Most recent depression screenings:    10/27/2022    2:48 PM 08/25/2022   10:37 AM  PHQ 2/9 Scores  PHQ - 2 Score 0 0  PHQ- 9 Score 0 0    HPI  Chronic RLQ pain intermittent, associated with right groin pain worse with strain or sudden movement or beginning of menstrual cycle. Improves with rest. S/p appendectomy.Emily Gomez  Possibly related to hip joint vs ovaries vs scar tissue? Advised about need for pelvic US. She declined at this time.  Past Medical History:  Diagnosis Date  . Medical history non-contributory   . Pre-eclampsia in puerperium 04/11/2018   Past Surgical History:  Procedure Laterality Date  . APPENDECTOMY    . CESAREAN SECTION N/A 04/03/2018   Procedure: CESAREAN SECTION;  Surgeon: Emily Baars, MD;  Location: [facility] BIRTHING SUITES;  Service: Obstetrics;  Laterality: N/A;   Social History   Socioeconomic History  . Marital status: Single    Spouse name: Not on file  . Number of children: Not on file  . Years of education: Not on file  . Highest education level: Not on file  Occupational History  . Not on file  Tobacco Use  . Smoking status: Never  . Smokeless tobacco: Never  Substance and Sexual Activity  . Alcohol use: No    Alcohol/week: 0.0 standard drinks of alcohol  . Drug use: No  .  Sexual activity: Yes    Partners: Male    Birth control/protection: Condom  Other Topics Concern  . Not on file  Social History Narrative   Lives with boyfriend and his mother (boyfriend is in [place])   No children    Works at the Emergency planning/management officer at [employer]   She is studying medical office administration at [employer] up in [place], moved here 3/13   Has cousins here but rest of family is back in [place]   Social Determinants of Corporate investment banker Strain: Not on file  Food Insecurity: Not on file  Transportation Needs: Not on file  Physical Activity: Not on file  Stress: Not on file  Social Connections: Not on file  Intimate Partner Violence: Not on file   Family Status  Relation Name Status  . Mother  Alive       healthy  . Father  Alive       healthy  . Neg Hx  (Not Specified)   Family History  Problem Relation Age of Onset  . Cancer Neg Hx   . Kidney disease Neg Hx   . Heart disease Neg Hx    No Known Allergies  Patient Care Team: Sandford Craze, NP as PCP - General (Internal Medicine) Ob/Gyn, Nestor Ramp  Medications: Outpatient Medications Prior to Visit  Medication Sig  . Clascoterone (WINLEVI) 1 % CREA APPLY TO FACE UP TO TWICE A DAY FOR ACNE AS TOLERATED   No facility-administered medications prior to visit.    Review of Systems  Constitutional:  Negative for fever.  HENT:  Negative for congestion and sore throat.   Eyes:        Negative for visual changes  Respiratory:  Negative for cough and shortness of breath.   Cardiovascular:  Negative for chest pain, palpitations and leg swelling.  Gastrointestinal:  Positive for abdominal pain. Negative for abdominal distention, anal bleeding, blood in stool, constipation, diarrhea, nausea and rectal pain.  Genitourinary:  Positive for pelvic pain. Negative for decreased urine volume, dyspareunia, dysuria, flank pain, frequency, urgency, vaginal discharge and vaginal pain.  Musculoskeletal:   Negative for myalgias.  Skin:  Negative for rash.  Neurological:  Negative for dizziness and headaches.  Hematological:  Does not bruise/bleed easily.  Psychiatric/Behavioral:  Negative for suicidal ideas. The patient is not nervous/anxious.         Objective:  BP 114/74 (BP Location: Right Arm, Patient Position: Sitting, Cuff Size: Small)   Pulse 75   Temp 97.8 F (36.6 C) (Temporal)   Ht 5' (1.524 m)   Wt 133 lb 6.4 oz (60.5 kg)   LMP 10/13/2022 (Exact Date)   SpO2 96%   BMI 26.05 kg/m     Physical Exam Vitals reviewed.  Constitutional:      General: She is not in acute distress. HENT:     Right Ear: Tympanic membrane, ear canal and external ear normal.     Left Ear: Tympanic membrane, ear canal and external ear normal.     Nose: Nose normal.  Eyes:     General: No scleral icterus.    Extraocular Movements: Extraocular movements intact.     Conjunctiva/sclera: Conjunctivae normal.  Cardiovascular:     Rate and Rhythm: Normal rate and regular rhythm.     Pulses: Normal pulses.     Heart sounds: Normal heart sounds.  Pulmonary:     Effort: Pulmonary effort is normal. No respiratory distress.     Breath sounds: Normal breath sounds.  Abdominal:     General: A surgical scar is present. Bowel sounds are normal. There is no distension.     Palpations: Abdomen is soft. There is no mass.     Tenderness: There is abdominal tenderness in the right lower quadrant. There is guarding. There is no right CVA tenderness, left CVA tenderness or rebound.     Hernia: No hernia is present. There is no hernia in the left inguinal area or right inguinal area.     Comments: S/p appendectomy with surgical scar in RLQ  Genitourinary:    Comments: Deferred breast and pelvic exam to GYN Right groin tenderness Musculoskeletal:        General: Normal range of motion.     Cervical back: Normal range of motion and neck supple.     Right hip: Tenderness present. No bony tenderness or crepitus.  Normal range of motion. Normal strength.     Left hip: Normal.     Right lower leg: No edema.     Left lower leg: No edema.     Comments: Right groin pain with hip flexion  Lymphadenopathy:     Cervical: No cervical adenopathy.     Lower Body: No right inguinal adenopathy. No left inguinal adenopathy.  Skin:    General:  Skin is warm and dry.  Neurological:     Mental Status: She is alert and oriented to person, place, and time.  Psychiatric:        Mood and Affect: Mood normal.        Behavior: Behavior normal.        Thought Content: Thought content normal.     No results found for any visits on 10/27/22.    Assessment & Plan:    Routine Health Maintenance and Physical Exam  Immunization History  Administered Date(s) Administered  . Tdap 12/23/2014, 01/16/2018   Health Maintenance  Topic Date Due  . PAP SMEAR-Modifier  02/03/2018  . INFLUENZA VACCINE  02/26/2023 (Originally 06/28/2022)  . Hepatitis C Screening  10/28/2023 (Originally 04/20/2007)  . DTaP/Tdap/Td (3 - Td or Tdap) 01/17/2028  . HIV Screening  Completed  . HPV VACCINES  Aged Out   Discussed health benefits of physical activity, and encouraged her to engage in regular exercise appropriate for her age and condition.  Problem List Items Addressed This Visit       Endocrine   Thyromegaly   Relevant Orders   TSH     Other   Chronic RLQ pain    intermittent, associated with right groin pain worse with strain or sudden movement or beginning of menstrual cycle. Improves with rest. S/p appendectomy.Emily Gomez  Possibly related to hip joint vs ovaries vs scar tissue? Advised about need for pelvic US. She declined at this time.      Other Visit Diagnoses     Encounter for preventative adult health care exam with abnormal findings    -  Primary   Relevant Orders   CBC with Differential/Platelet   Comprehensive metabolic panel   Encounter for lipid screening for cardiovascular disease       Relevant Orders    Lipid panel      Return in about 1 year (around 10/28/2023) for CPE (fasting).     Alysia Penna, NP

## 2022-10-27 NOTE — Patient Instructions (Addendum)
Go to lab Let me know if you change you mind about pelvic US  Preventive Care 13-33 Years Old, Female Preventive care refers to lifestyle choices and visits with your health care provider that can promote health and wellness. Preventive care visits are also called wellness exams. What can I expect for my preventive care visit? Counseling During your preventive care visit, your health care provider may ask about your: Medical history, including: Past medical problems. Family medical history. Pregnancy history. Current health, including: Menstrual cycle. Method of birth control. Emotional well-being. Home life and relationship well-being. Sexual activity and sexual health. Lifestyle, including: Alcohol, nicotine or tobacco, and drug use. Access to firearms. Diet, exercise, and sleep habits. Work and work Statistician. Sunscreen use. Safety issues such as seatbelt and bike helmet use. Physical exam Your health care provider may check your: Height and weight. These may be used to calculate your BMI (body mass index). BMI is a measurement that tells if you are at a healthy weight. Waist circumference. This measures the distance around your waistline. This measurement also tells if you are at a healthy weight and may help predict your risk of certain diseases, such as type 2 diabetes and high blood pressure. Heart rate and blood pressure. Body temperature. Skin for abnormal spots. What immunizations do I need?  Vaccines are usually given at various ages, according to a schedule. Your health care provider will recommend vaccines for you based on your age, medical history, and lifestyle or other factors, such as travel or where you work. What tests do I need? Screening Your health care provider may recommend screening tests for certain conditions. This may include: Pelvic exam and Pap test. Lipid and cholesterol levels. Diabetes screening. This is done by checking your blood sugar  (glucose) after you have not eaten for a while (fasting). Hepatitis B test. Hepatitis C test. HIV (human immunodeficiency virus) test. STI (sexually transmitted infection) testing, if you are at risk. BRCA-related cancer screening. This may be done if you have a family history of breast, ovarian, tubal, or peritoneal cancers. Talk with your health care provider about your test results, treatment options, and if necessary, the need for more tests. Follow these instructions at home: Eating and drinking  Eat a healthy diet that includes fresh fruits and vegetables, whole grains, lean protein, and low-fat dairy products. Take vitamin and mineral supplements as recommended by your health care provider. Do not drink alcohol if: Your health care provider tells you not to drink. You are pregnant, may be pregnant, or are planning to become pregnant. If you drink alcohol: Limit how much you have to 0-1 drink a day. Know how much alcohol is in your drink. In the [place] one drink equals one 12 oz bottle of beer (355 mL), one 5 oz glass of wine (148 mL), or one 1 oz glass of hard liquor (44 mL). Lifestyle Brush your teeth every morning and night with fluoride toothpaste. Floss one time each day. Exercise for at least 30 minutes 5 or more days each week. Do not use any products that contain nicotine or tobacco. These products include cigarettes, chewing tobacco, and vaping devices, such as e-cigarettes. If you need help quitting, ask your health care provider. Do not use drugs. If you are sexually active, practice safe sex. Use a condom or other form of protection to prevent STIs. If you do not wish to become pregnant, use a form of birth control. If you plan to become pregnant, see your health  care provider for a prepregnancy visit. Find healthy ways to manage stress, such as: Meditation, yoga, or listening to music. Journaling. Talking to a trusted person. Spending time with friends and  family. Minimize exposure to UV radiation to reduce your risk of skin cancer. Safety Always wear your seat belt while driving or riding in a vehicle. Do not drive: If you have been drinking alcohol. Do not ride with someone who has been drinking. If you have been using any mind-altering substances or drugs. While texting. When you are tired or distracted. Wear a helmet and other protective equipment during sports activities. If you have firearms in your house, make sure you follow all gun safety procedures. Seek help if you have been physically or sexually abused. What's next? Go to your health care provider once a year for an annual wellness visit. Ask your health care provider how often you should have your eyes and teeth checked. Stay up to date on all vaccines. This information is not intended to replace advice given to you by your health care provider. Make sure you discuss any questions you have with your health care provider. Document Revised: 05/12/2021 Document Reviewed: 05/12/2021 [employer] Patient Education  [place].

## 2023-10-20 ENCOUNTER — Encounter: Payer: Self-pay | Admitting: Nurse Practitioner
